# Patient Record
Sex: Male | Born: 1975 | Race: White | Hispanic: No | Marital: Married | Smoking: Former smoker
Health system: Southern US, Community
[De-identification: ages and names within clinical notes are randomized; demographics above are authoritative.]

## PROBLEM LIST (undated history)

## (undated) DIAGNOSIS — K56609 Unspecified intestinal obstruction, unspecified as to partial versus complete obstruction: Secondary | ICD-10-CM

## (undated) DIAGNOSIS — K219 Gastro-esophageal reflux disease without esophagitis: Secondary | ICD-10-CM

## (undated) DIAGNOSIS — Z136 Encounter for screening for cardiovascular disorders: Secondary | ICD-10-CM

## (undated) HISTORY — PX: KNEE SURGERY: SHX244

## (undated) HISTORY — DX: Unspecified intestinal obstruction, unspecified as to partial versus complete obstruction: K56.609

## (undated) HISTORY — PX: OTHER SURGICAL HISTORY: SHX169

---

## 1997-09-26 ENCOUNTER — Inpatient Hospital Stay (HOSPITAL_COMMUNITY): Admission: EM | Admit: 1997-09-26 | Discharge: 1997-09-28 | Payer: Self-pay | Admitting: Emergency Medicine

## 2002-01-25 ENCOUNTER — Emergency Department (HOSPITAL_COMMUNITY): Admission: EM | Admit: 2002-01-25 | Discharge: 2002-01-25 | Payer: Self-pay | Admitting: Emergency Medicine

## 2002-05-16 ENCOUNTER — Ambulatory Visit (HOSPITAL_BASED_OUTPATIENT_CLINIC_OR_DEPARTMENT_OTHER): Admission: RE | Admit: 2002-05-16 | Discharge: 2002-05-16 | Payer: Self-pay | Admitting: Orthopedic Surgery

## 2002-06-16 ENCOUNTER — Encounter: Payer: Self-pay | Admitting: Emergency Medicine

## 2002-06-16 ENCOUNTER — Emergency Department (HOSPITAL_COMMUNITY): Admission: EM | Admit: 2002-06-16 | Discharge: 2002-06-16 | Payer: Self-pay

## 2003-07-03 ENCOUNTER — Emergency Department (HOSPITAL_COMMUNITY): Admission: EM | Admit: 2003-07-03 | Discharge: 2003-07-03 | Payer: Self-pay | Admitting: Emergency Medicine

## 2004-04-28 ENCOUNTER — Emergency Department (HOSPITAL_COMMUNITY): Admission: EM | Admit: 2004-04-28 | Discharge: 2004-04-28 | Payer: Self-pay | Admitting: Emergency Medicine

## 2006-04-11 ENCOUNTER — Emergency Department (HOSPITAL_COMMUNITY): Admission: EM | Admit: 2006-04-11 | Discharge: 2006-04-12 | Payer: Self-pay | Admitting: Emergency Medicine

## 2008-02-03 IMAGING — CT CT ABDOMEN W/ CM
2 of 5 series · 17 of 46 positions shown, 19 images · IV contrast (APPLIED)
Comparison: None.

ABDOMEN CT WITH CONTRAST:

CLINICAL DATA: Left-sided abdominal pain
TECHNIQUE: Multidetector CT imaging of the abdomen and pelvis was performed
following the standard protocol during bolus administration of intravenous
contrast.

Contrast:  100 cc Omnipaque 300

[Series 2: abd/pelv with 5.0 b31f st · axial · 0.68mm/px · z∈[-463,-28]mm · 14 of 97 slices shown, 16 images]
[im 5/97  soft-tissue]
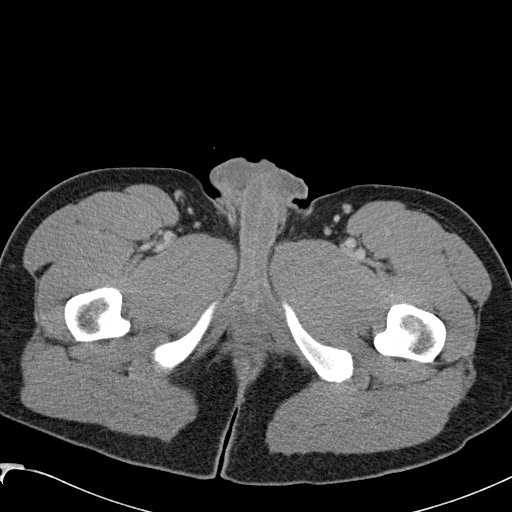
[im 5/97  bone]
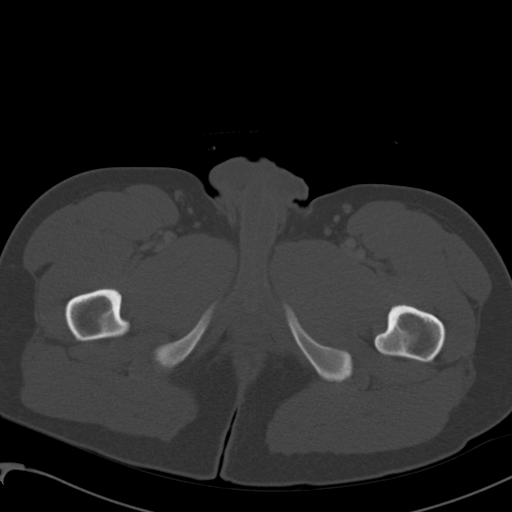
[im 15/97  soft-tissue]
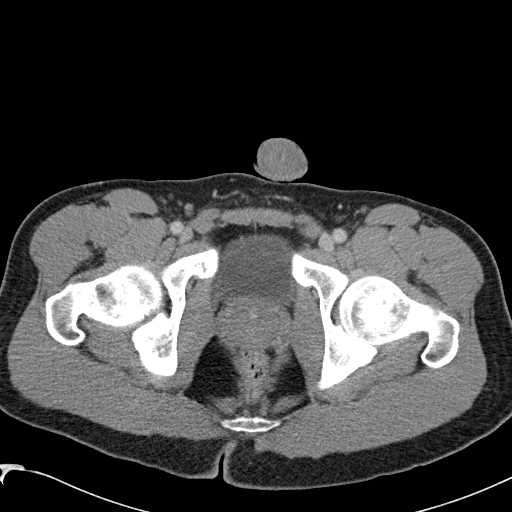
[im 20/97  soft-tissue]
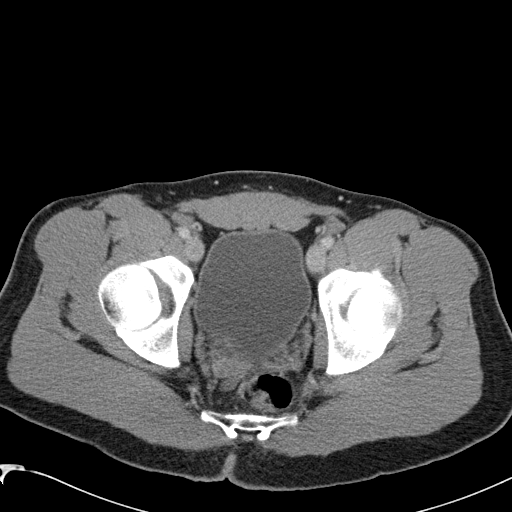
[im 25/97  soft-tissue]
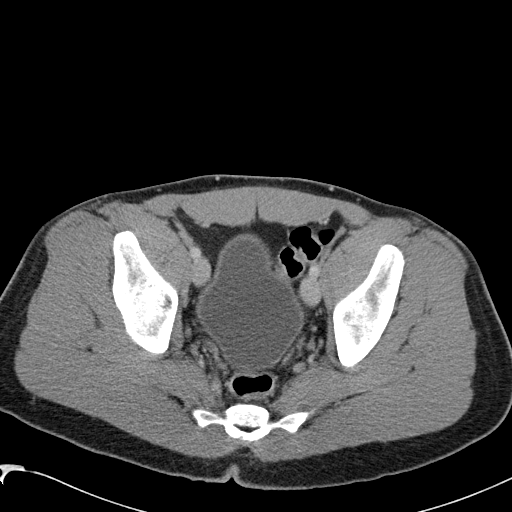
[im 34/97  soft-tissue]
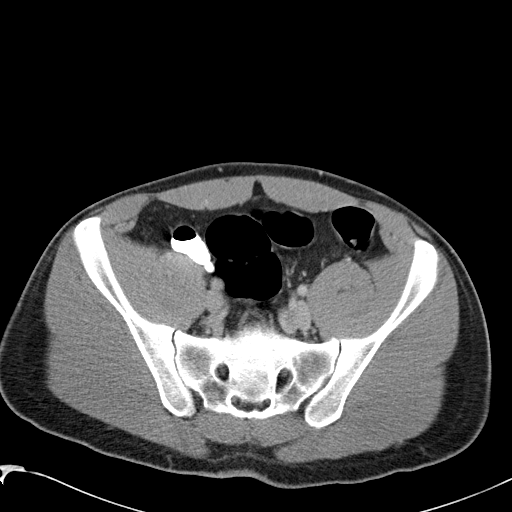
[im 39/97  soft-tissue]
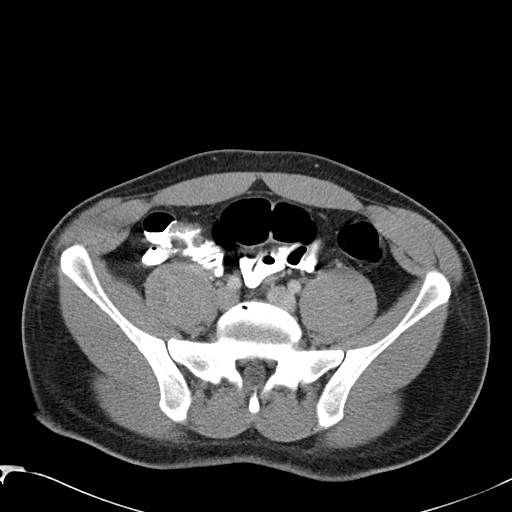
[im 44/97  soft-tissue]
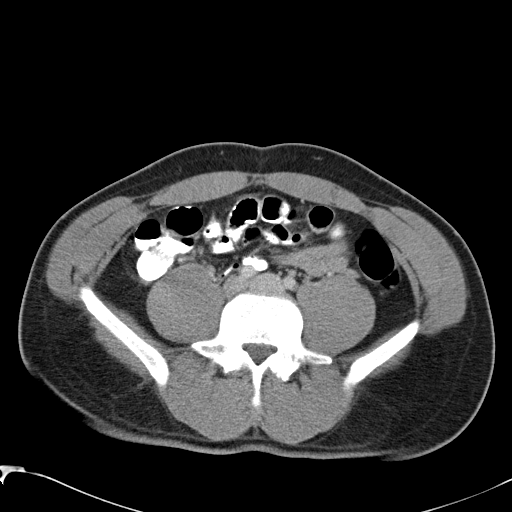
[im 53/97  soft-tissue]
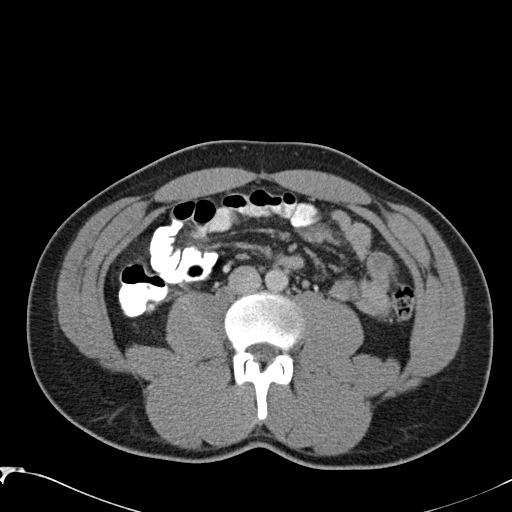
[im 58/97  soft-tissue]
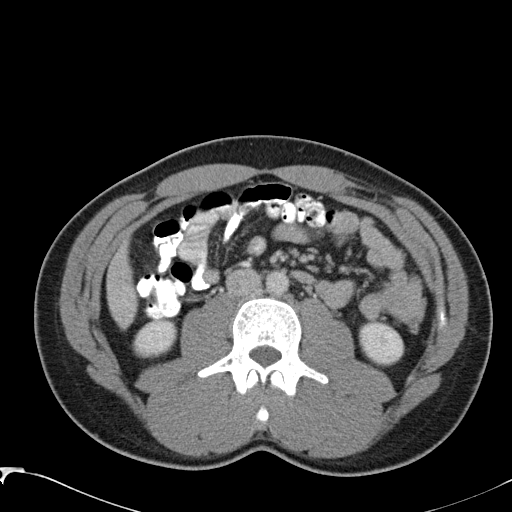
[im 58/97  bone]
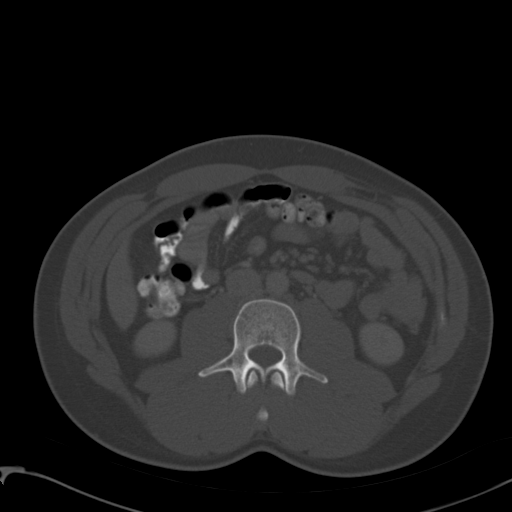
[im 63/97  soft-tissue]
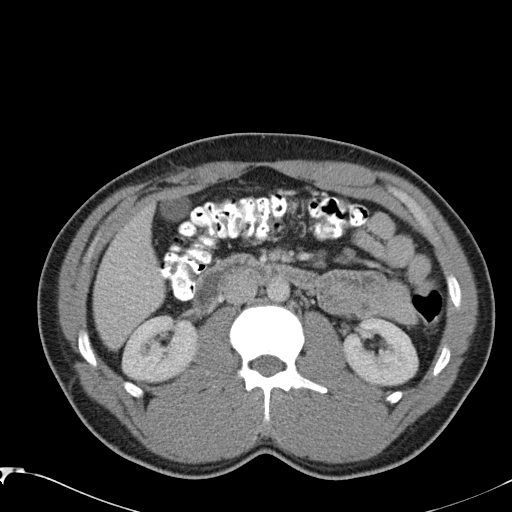
[im 73/97  soft-tissue]
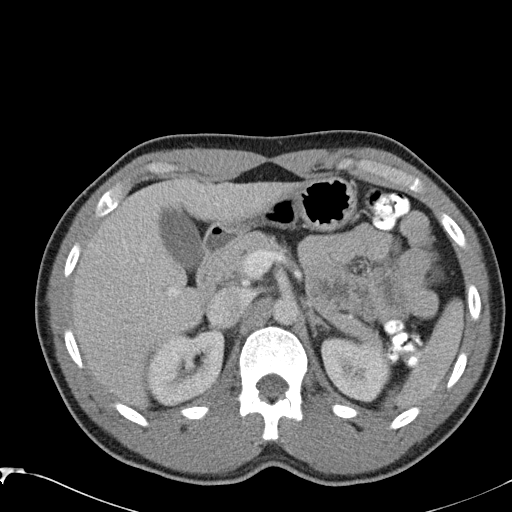
[im 77/97  soft-tissue]
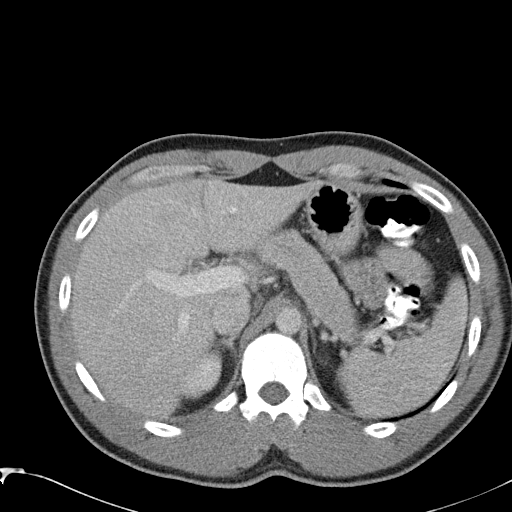
[im 82/97  soft-tissue]
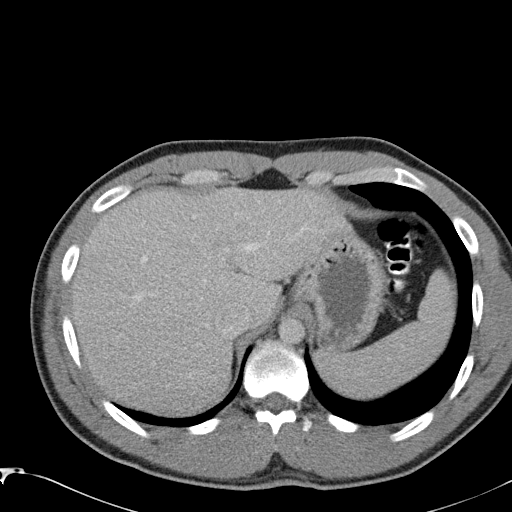
[im 92/97  soft-tissue]
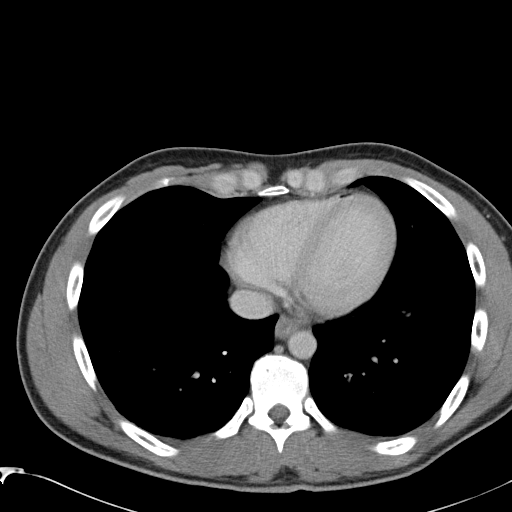

[Series 602: cor · coronal · 0.94mm/px · 3 of 99 slices shown]
[im 33/99  soft-tissue]
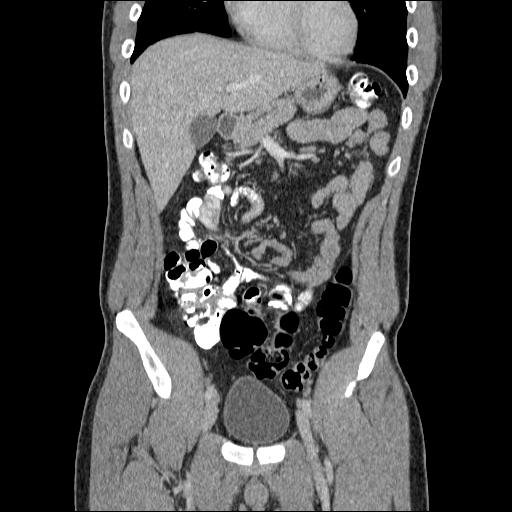
[im 44/99  soft-tissue]
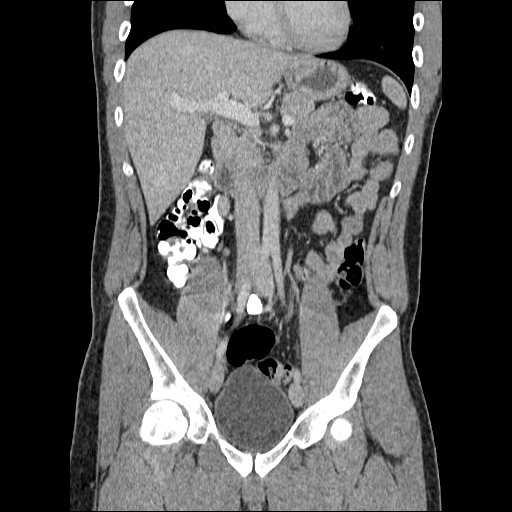
[im 55/99  soft-tissue]
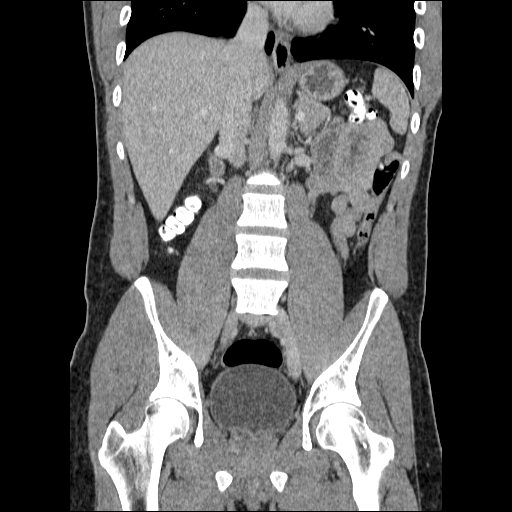

[17 of 46 positions shown; findings below may reference images not displayed]

FINDINGS: The liver, spleen, stomach, duodenum, pancreas, gallbladder, adrenal
glands, and kidneys are unremarkable. No abdominal lymphadenopathy. No
intraperitoneal free fluid. Bowel loops are unremarkable.
IMPRESSION: No acute findings in the abdomen.

PELVIS CT WITH CONTRAST:
FINDINGS: No evidence for intraperitoneal free fluid. No pelvic
lymphadenopathy. 1.3 cm cystic focus posterior to the right seminal vesicle may
be a small right seminal vesicle cyst. The urinary bladder is unremarkable. No
distal ureteral or bladder calculi. The terminal ileum and appendix are normal.

Bone windows are unremarkable.
IMPRESSION: No acute findings in the anatomic pelvis.

Question small right seminal vesicle cyst.

## 2011-09-24 ENCOUNTER — Emergency Department (HOSPITAL_COMMUNITY)
Admission: EM | Admit: 2011-09-24 | Discharge: 2011-09-24 | Disposition: A | Discharge status: Home / Self Care | Payer: BC Managed Care – PPO | Attending: Emergency Medicine | Admitting: Emergency Medicine

## 2011-09-24 ENCOUNTER — Encounter (HOSPITAL_COMMUNITY): Payer: Self-pay | Admitting: Emergency Medicine

## 2011-09-24 DIAGNOSIS — L03039 Cellulitis of unspecified toe: Secondary | ICD-10-CM | POA: Insufficient documentation

## 2011-09-24 DIAGNOSIS — L600 Ingrowing nail: Secondary | ICD-10-CM

## 2011-09-24 DIAGNOSIS — Z885 Allergy status to narcotic agent status: Secondary | ICD-10-CM | POA: Insufficient documentation

## 2011-09-24 DIAGNOSIS — M79609 Pain in unspecified limb: Secondary | ICD-10-CM | POA: Insufficient documentation

## 2011-09-24 DIAGNOSIS — IMO0002 Reserved for concepts with insufficient information to code with codable children: Secondary | ICD-10-CM

## 2011-09-24 DIAGNOSIS — F17200 Nicotine dependence, unspecified, uncomplicated: Secondary | ICD-10-CM | POA: Insufficient documentation

## 2011-09-24 MED ORDER — LIDOCAINE HCL 2 % IJ SOLN: 10.0000 mL | Freq: Once | INTRAMUSCULAR | Status: DC

## 2011-09-24 MED ORDER — SULFAMETHOXAZOLE-TRIMETHOPRIM 800-160 MG PO TABS: 1.0000 | ORAL_TABLET | Freq: Two times a day (BID) | ORAL | Status: AC

## 2011-09-24 MED ORDER — DICLOFENAC SODIUM 75 MG PO TBEC: 75.0000 mg | DELAYED_RELEASE_TABLET | Freq: Two times a day (BID) | ORAL | Status: DC

## 2011-09-24 NOTE — ED Notes (Signed)
Pt reports one month hx of ingrown toenail-r/gfoot -great (5th) toe

## 2011-09-24 NOTE — ED Provider Notes (Signed)
History     CSN: [id number]  Arrival date & time 09/24/11  1601   First MD Initiated Contact with Patient 09/24/11 1604     5:22 PM HPI Patient reports for one month's had a worsening right great toe ingrown toenail. Ports gradually beginning to have infection in his toe. Reports purulent drainage occasionally. Reports mild improvement with warm water soaks. 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 Denies fever but states his toe is turning red.  Patient is a 36 y.o. male presenting with toe pain. The history is provided by the patient.  Toe Pain This is a new problem. The current episode started in the past 7 days. The problem occurs constantly. The problem has been gradually worsening. Pertinent negatives include no chills, fever, joint swelling, numbness or weakness. The symptoms are aggravated by walking and standing. He has tried nothing (warm water soaks) for the symptoms. The treatment provided mild relief.    History reviewed. No pertinent past medical history.  Past Surgical History  Procedure Date  . Knee surgery   . Thumb surgery     History reviewed. No pertinent family history.  History  Substance Use Topics  . Smoking status: Not on file  . Smokeless tobacco: Current User    Types: Snuff  . Alcohol Use: Yes      Review of Systems  Constitutional: Negative for fever and chills.  Musculoskeletal: Negative for joint swelling.       Toe pain and infection  Skin: Positive for wound.  Neurological: Negative for weakness and numbness.  All other systems reviewed and are negative.    Allergies  Codeine and Other  Home Medications  No current outpatient prescriptions on file.  BP 127/67  Pulse 83  Temp 98.2 F (36.8 C) (Oral)  SpO2 96%  Physical Exam  Vitals reviewed. Constitutional: He is oriented to person, place, and time. He appears well-developed and well-nourished.  HENT:  Head: Normocephalic and atraumatic.    Eyes: Pupils are equal, round, and reactive to light.  Musculoskeletal:       Right toe medial paronychia. Mild active drainage. Surrounding erythema.   Neurological: He is alert and oriented to person, place, and time.  Skin: Skin is warm and dry. No rash noted. No erythema. No pallor.  Psychiatric: He has a normal mood and affect. His behavior is normal.    ED Course  Drain paronychia Date/Time: 09/24/2011 5:46 PM Performed by: Thomasene Lot Authorized by: Thomasene Lot Consent: Verbal consent obtained. Consent given by: patient Patient understanding: patient states understanding of the procedure being performed Patient identity confirmed: verbally with patient Local anesthesia used: yes Anesthesia: digital block Local anesthetic: lidocaine 2% without epinephrine Anesthetic total: 4 ml Patient sedated: no Patient tolerance: Patient tolerated the procedure well with no immediate complications.  NAIL REMOVAL Date/Time: 09/24/2011 5:46 AM Performed by: Thomasene Lot Authorized by: Thomasene Lot Consent: Verbal consent obtained. Consent given by: patient Patient understanding: patient states understanding of the procedure being performed Patient identity confirmed: verbally with patient Time out: Immediately prior to procedure a "time out" was called to verify the correct patient, procedure, equipment, support staff and site/side marked as required. Location: right foot Anesthesia: digital block Local anesthetic: lidocaine 2% without epinephrine Anesthetic total: 4 ml Patient sedated: no Preparation: skin prepped with alcohol and skin prepped with Betadine Amount removed: 1/5 Wedge excision of skin of nail fold: no Nail bed sutured: no Nail matrix removed: none Removed nail replaced  and anchored: no Dressing: 4x4, gauze roll and antibiotic ointment Patient tolerance: Patient tolerated the procedure well with no immediate complications.     MDM   Provided  patient with referral for podiatry if persistent symptoms. Advised continued use of warm water compresses. Will prescribe Bactrim in case of infection since entire distal tip of toe is erythematous       Thomasene Lot, PA-C 09/24/11 1823

## 2011-09-24 NOTE — Discharge Instructions (Signed)
Infected Ingrown Toenail  An infected ingrown toenail occurs when the nail edge grows into the skin and bacteria invade the area. Symptoms include pain, tenderness, swelling, and pus drainage from the edge of the nail. Poorly fitting shoes, minor injuries, and improper cutting of the toenail may also contribute to the problem. You should cut your toenails squarely instead of rounding the edges. Do not cut them too short. Avoid tight or pointed toe shoes. Sometimes the ingrown portion of the nail must be removed. If your toenail is removed, it can take 3-4 months for it to re-grow.  HOME CARE INSTRUCTIONS     Soak your infected toe in warm water for 20-30 minutes, 2 to 3 times a day.    Packing or dressings applied to the area should be changed daily.    Take medicine as directed and finish them.    Reduce activities and keep your foot elevated when able to reduce swelling and discomfort. Do this until the infection gets better.    Wear sandals or go barefoot as much as possible while the infected area is sensitive.    See your caregiver for follow-up care in 2-3 days if the infection is not better.   SEEK MEDICAL CARE IF:    Your toe is becoming more red, swollen or painful.  MAKE SURE YOU:     Understand these instructions.    Will watch your condition.    Will get help right away if you are not doing well or get worse.   Document Released: 04/29/2004 Document Revised: 03/11/2011 Document Reviewed: 03/18/2008  [employer] Patient Information 2012 [employer].

## 2011-09-24 NOTE — ED Notes (Signed)
Correction 1st toe r/foot

## 2011-09-25 NOTE — ED Provider Notes (Signed)
Medical screening examination/treatment/procedure(s) were performed by non-physician practitioner and as supervising physician I was immediately available for consultation/collaboration.   Cleora Karnik M Stepheny Canal, MD 09/25/11 0037 

## 2012-03-22 ENCOUNTER — Other Ambulatory Visit: Payer: Self-pay | Admitting: Neurosurgery

## 2012-03-23 ENCOUNTER — Encounter (HOSPITAL_COMMUNITY): Payer: Self-pay | Admitting: Pharmacy Technician

## 2012-03-27 ENCOUNTER — Encounter (HOSPITAL_COMMUNITY)
Admission: RE | Admit: 2012-03-27 | Discharge: 2012-03-27 | Disposition: A | Discharge status: Home / Self Care | Payer: BC Managed Care – PPO | Source: Ambulatory Visit | Attending: Neurosurgery | Admitting: Neurosurgery

## 2012-03-27 ENCOUNTER — Encounter (HOSPITAL_COMMUNITY): Payer: Self-pay

## 2012-03-27 HISTORY — DX: Gastro-esophageal reflux disease without esophagitis: K21.9

## 2012-03-27 HISTORY — DX: Encounter for screening for cardiovascular disorders: Z13.6

## 2012-03-27 LAB — BASIC METABOLIC PANEL
BUN: 17 mg/dL (ref 6–23)
CO2: 26 mEq/L (ref 19–32)
Chloride: 102 mEq/L (ref 96–112)
Creatinine, Ser: 1.16 mg/dL (ref 0.50–1.35)
Glucose, Bld: 92 mg/dL (ref 70–99)

## 2012-03-27 LAB — CBC WITH DIFFERENTIAL/PLATELET
Basophils Absolute: 0.1 10*3/uL (ref 0.0–0.1)
Basophils Relative: 1 % (ref 0–1)
Eosinophils Relative: 6 % — ABNORMAL HIGH (ref 0–5)
HCT: 39.6 % (ref 39.0–52.0)
MCHC: 35.4 g/dL (ref 30.0–36.0)
MCV: 85.2 fL (ref 78.0–100.0)
Monocytes Absolute: 0.7 10*3/uL (ref 0.1–1.0)
RDW: 12.4 % (ref 11.5–15.5)

## 2012-03-27 LAB — URINALYSIS, ROUTINE W REFLEX MICROSCOPIC
Bilirubin Urine: NEGATIVE
Hgb urine dipstick: NEGATIVE
Ketones, ur: NEGATIVE mg/dL
Nitrite: NEGATIVE
pH: 5.5 (ref 5.0–8.0)

## 2012-03-27 LAB — PROTIME-INR: INR: 1.05 (ref 0.00–1.49)

## 2012-03-27 NOTE — Progress Notes (Signed)
1530   PATIENT STATED HE HAD STRESS TREADMILL, HE THINKS MORE THEN 5-6 YRS AGO, BUT CAN'T REMEMBER WHERE.....he DID NOT HAVE ANY SYMPTOMS, HE HAD A FRIEND THAT WAS YOUNG AND HAD HEART PROBLEMS AND HE WANTED A BASELINE...TEST WAS NORMAL............DA

## 2012-03-27 NOTE — Pre-Procedure Instructions (Signed)
20 Darrell Cooper  03/27/2012   Your procedure is scheduled on: Tuesday, December 31   Report to [facility] at 6:30 AM. (Per your surgeon's request)  Call this number if you have problems the morning of surgery: [phone]   Remember:   Do not eat food or drink any liquids:After Midnight Monday.    Take these medicines the morning of surgery with A SIP OF WATER: None   Do not wear jewelry.  Do not wear lotions, powders, or colognes. You may NOT wear deodorant.   Men may shave face and neck.   Do not bring valuables to the hospital.  Contacts, dentures or bridgework may not be worn into surgery.   Leave suitcase in the car. After surgery it may be brought to your room.  For patients admitted to the hospital, checkout time is 11:00 AM the day of discharge.   Patients discharged the day of surgery will not be allowed to drive home.   Name and phone number of your driver:    Special Instructions: Shower using CHG 2 nights before surgery and the night before surgery.  If you shower the day of surgery use CHG.  Use special wash - you have one bottle of CHG for all showers.  You should use approximately 1/3 of the bottle for each shower.   Please read over the following fact sheets that you were given: Pain Booklet, Coughing and Deep Breathing, MRSA Information and Surgical Site Infection Prevention

## 2012-04-03 MED ORDER — CEFAZOLIN SODIUM-DEXTROSE 2-3 GM-% IV SOLR
2.0000 g | INTRAVENOUS | Status: DC
  Filled 2012-04-03: qty 50

## 2012-04-04 ENCOUNTER — Encounter (HOSPITAL_COMMUNITY): Payer: Self-pay | Admitting: *Deleted

## 2012-04-04 ENCOUNTER — Encounter (HOSPITAL_COMMUNITY)
Admission: RE | Disposition: A | Discharge status: Home / Self Care | Payer: Self-pay | Source: Ambulatory Visit | Attending: Neurosurgery

## 2012-04-04 ENCOUNTER — Ambulatory Visit (HOSPITAL_COMMUNITY): Payer: BC Managed Care – PPO

## 2012-04-04 ENCOUNTER — Encounter (HOSPITAL_COMMUNITY): Payer: Self-pay | Admitting: Anesthesiology

## 2012-04-04 ENCOUNTER — Ambulatory Visit (HOSPITAL_COMMUNITY)
Admission: RE | Admit: 2012-04-04 | Discharge: 2012-04-04 | Disposition: A | Discharge status: Home / Self Care | Payer: BC Managed Care – PPO | Source: Ambulatory Visit | Attending: Neurosurgery | Admitting: Neurosurgery

## 2012-04-04 ENCOUNTER — Ambulatory Visit (HOSPITAL_COMMUNITY): Payer: BC Managed Care – PPO | Admitting: Anesthesiology

## 2012-04-04 DIAGNOSIS — Z01812 Encounter for preprocedural laboratory examination: Secondary | ICD-10-CM | POA: Insufficient documentation

## 2012-04-04 DIAGNOSIS — M4712 Other spondylosis with myelopathy, cervical region: Secondary | ICD-10-CM | POA: Insufficient documentation

## 2012-04-04 DIAGNOSIS — M4802 Spinal stenosis, cervical region: Secondary | ICD-10-CM | POA: Insufficient documentation

## 2012-04-04 DIAGNOSIS — M5000 Cervical disc disorder with myelopathy, unspecified cervical region: Secondary | ICD-10-CM | POA: Insufficient documentation

## 2012-04-04 HISTORY — PX: ANTERIOR CERVICAL DECOMP/DISCECTOMY FUSION: SHX1161

## 2012-04-04 SURGERY — ANTERIOR CERVICAL DECOMPRESSION/DISCECTOMY FUSION 2 LEVELS
Anesthesia: General | Site: Neck | Wound class: Clean

## 2012-04-04 MED ORDER — ACETAMINOPHEN 325 MG PO TABS: 650.0000 mg | ORAL_TABLET | ORAL | Status: DC | PRN

## 2012-04-04 MED ORDER — MIDAZOLAM HCL 5 MG/5ML IJ SOLN
INTRAMUSCULAR | Status: DC | PRN
  Administered 2012-04-04 (×2): 1 mg via INTRAVENOUS

## 2012-04-04 MED ORDER — CEFAZOLIN SODIUM 1-5 GM-% IV SOLN
1.0000 g | Freq: Three times a day (TID) | INTRAVENOUS | Status: DC
  Administered 2012-04-04: 1 g via INTRAVENOUS
  Filled 2012-04-04 (×2): qty 50

## 2012-04-04 MED ORDER — OXYCODONE HCL 5 MG PO TABS: 5.0000 mg | ORAL_TABLET | Freq: Once | ORAL | Status: DC | PRN

## 2012-04-04 MED ORDER — LIDOCAINE HCL (CARDIAC) 20 MG/ML IV SOLN
INTRAVENOUS | Status: DC | PRN
  Administered 2012-04-04: 70 mg via INTRAVENOUS

## 2012-04-04 MED ORDER — NEOSTIGMINE METHYLSULFATE 1 MG/ML IJ SOLN
INTRAMUSCULAR | Status: DC | PRN
  Administered 2012-04-04: 4 mg via INTRAVENOUS

## 2012-04-04 MED ORDER — GLYCOPYRROLATE 0.2 MG/ML IJ SOLN
INTRAMUSCULAR | Status: DC | PRN
  Administered 2012-04-04: 0.6 mg via INTRAVENOUS
  Administered 2012-04-04: 0.2 mg via INTRAVENOUS

## 2012-04-04 MED ORDER — HYDROCODONE-ACETAMINOPHEN 5-325 MG PO TABS: 1.0000 | ORAL_TABLET | ORAL | Status: DC | PRN

## 2012-04-04 MED ORDER — MAGNESIUM HYDROXIDE 400 MG/5ML PO SUSP: 30.0000 mL | Freq: Every day | ORAL | Status: DC | PRN

## 2012-04-04 MED ORDER — LIDOCAINE-EPINEPHRINE 1 %-1:100000 IJ SOLN
INTRAMUSCULAR | Status: DC | PRN
  Administered 2012-04-04: 10 mL

## 2012-04-04 MED ORDER — ACETAMINOPHEN 10 MG/ML IV SOLN
INTRAVENOUS | Status: DC | PRN
  Administered 2012-04-04: 1000 mg via INTRAVENOUS

## 2012-04-04 MED ORDER — HEMOSTATIC AGENTS (NO CHARGE) OPTIME
TOPICAL | Status: DC | PRN
  Administered 2012-04-04: 1 via TOPICAL

## 2012-04-04 MED ORDER — ACETAMINOPHEN 10 MG/ML IV SOLN
INTRAVENOUS | Status: AC
  Filled 2012-04-04: qty 100

## 2012-04-04 MED ORDER — DEXAMETHASONE SODIUM PHOSPHATE 10 MG/ML IJ SOLN
INTRAMUSCULAR | Status: DC | PRN
  Administered 2012-04-04: 10 mg via INTRAVENOUS

## 2012-04-04 MED ORDER — SODIUM CHLORIDE 0.9 % IJ SOLN: 3.0000 mL | INTRAMUSCULAR | Status: DC | PRN

## 2012-04-04 MED ORDER — TRAMADOL HCL 50 MG PO TABS
50.0000 mg | ORAL_TABLET | Freq: Four times a day (QID) | ORAL | Status: DC | PRN
  Filled 2012-04-04: qty 1

## 2012-04-04 MED ORDER — ONDANSETRON HCL 4 MG/2ML IJ SOLN: 4.0000 mg | INTRAMUSCULAR | Status: DC | PRN

## 2012-04-04 MED ORDER — OXYCODONE HCL 5 MG/5ML PO SOLN: 5.0000 mg | Freq: Once | ORAL | Status: DC | PRN

## 2012-04-04 MED ORDER — BACITRACIN 50000 UNITS IM SOLR
INTRAMUSCULAR | Status: AC
  Filled 2012-04-04: qty 1

## 2012-04-04 MED ORDER — DIPHENHYDRAMINE HCL 25 MG PO CAPS: 25.0000 mg | ORAL_CAPSULE | ORAL | Status: DC | PRN

## 2012-04-04 MED ORDER — CYCLOBENZAPRINE HCL 10 MG PO TABS: 10.0000 mg | ORAL_TABLET | Freq: Three times a day (TID) | ORAL | Status: DC | PRN

## 2012-04-04 MED ORDER — ZOLPIDEM TARTRATE 5 MG PO TABS: 5.0000 mg | ORAL_TABLET | Freq: Every evening | ORAL | Status: DC | PRN

## 2012-04-04 MED ORDER — SODIUM CHLORIDE 0.9 % IV SOLN: 250.0000 mL | INTRAVENOUS | Status: DC

## 2012-04-04 MED ORDER — PROMETHAZINE HCL 25 MG/ML IJ SOLN: 12.5000 mg | INTRAMUSCULAR | Status: DC | PRN

## 2012-04-04 MED ORDER — SODIUM CHLORIDE 0.9 % IV SOLN
INTRAVENOUS | Status: AC
  Filled 2012-04-04: qty 500

## 2012-04-04 MED ORDER — HYDROMORPHONE HCL PF 1 MG/ML IJ SOLN
0.2500 mg | INTRAMUSCULAR | Status: DC | PRN
  Administered 2012-04-04 (×4): 0.5 mg via INTRAVENOUS

## 2012-04-04 MED ORDER — METHOCARBAMOL 100 MG/ML IJ SOLN
500.0000 mg | Freq: Four times a day (QID) | INTRAVENOUS | Status: DC | PRN
  Administered 2012-04-04: 500 mg via INTRAVENOUS
  Filled 2012-04-04: qty 5

## 2012-04-04 MED ORDER — METHOCARBAMOL 500 MG PO TABS
500.0000 mg | ORAL_TABLET | Freq: Four times a day (QID) | ORAL | Status: DC | PRN
  Filled 2012-04-04: qty 1

## 2012-04-04 MED ORDER — SODIUM CHLORIDE 0.9 % IJ SOLN: 3.0000 mL | Freq: Two times a day (BID) | INTRAMUSCULAR | Status: DC

## 2012-04-04 MED ORDER — ONDANSETRON HCL 4 MG/2ML IJ SOLN
INTRAMUSCULAR | Status: DC | PRN
  Administered 2012-04-04: 4 mg via INTRAVENOUS

## 2012-04-04 MED ORDER — PHENYLEPHRINE HCL 10 MG/ML IJ SOLN
INTRAMUSCULAR | Status: DC | PRN
  Administered 2012-04-04: 40 ug via INTRAVENOUS
  Administered 2012-04-04: 120 ug via INTRAVENOUS
  Administered 2012-04-04 (×2): 40 ug via INTRAVENOUS
  Administered 2012-04-04 (×2): 80 ug via INTRAVENOUS

## 2012-04-04 MED ORDER — LACTATED RINGERS IV SOLN: INTRAVENOUS | Status: DC

## 2012-04-04 MED ORDER — DOCUSATE SODIUM 100 MG PO CAPS
100.0000 mg | ORAL_CAPSULE | Freq: Two times a day (BID) | ORAL | Status: DC
  Administered 2012-04-04: 100 mg via ORAL
  Filled 2012-04-04: qty 1

## 2012-04-04 MED ORDER — PROPOFOL 10 MG/ML IV BOLUS
INTRAVENOUS | Status: DC | PRN
  Administered 2012-04-04: 200 mg via INTRAVENOUS

## 2012-04-04 MED ORDER — PROMETHAZINE HCL 25 MG/ML IJ SOLN: 6.2500 mg | INTRAMUSCULAR | Status: DC | PRN

## 2012-04-04 MED ORDER — ACETAMINOPHEN 650 MG RE SUPP: 650.0000 mg | RECTAL | Status: DC | PRN

## 2012-04-04 MED ORDER — FENTANYL CITRATE 0.05 MG/ML IJ SOLN
INTRAMUSCULAR | Status: DC | PRN
  Administered 2012-04-04 (×2): 50 ug via INTRAVENOUS
  Administered 2012-04-04: 100 ug via INTRAVENOUS

## 2012-04-04 MED ORDER — HYDROMORPHONE HCL PF 1 MG/ML IJ SOLN
INTRAMUSCULAR | Status: AC
  Filled 2012-04-04: qty 2

## 2012-04-04 MED ORDER — THROMBIN 5000 UNITS EX KIT
PACK | CUTANEOUS | Status: DC | PRN
  Administered 2012-04-04 (×2): 5000 [IU] via TOPICAL

## 2012-04-04 MED ORDER — KETOROLAC TROMETHAMINE 30 MG/ML IJ SOLN
30.0000 mg | Freq: Four times a day (QID) | INTRAMUSCULAR | Status: DC
  Administered 2012-04-04: 30 mg via INTRAVENOUS
  Filled 2012-04-04: qty 1

## 2012-04-04 MED ORDER — EPHEDRINE SULFATE 50 MG/ML IJ SOLN
INTRAMUSCULAR | Status: DC | PRN
  Administered 2012-04-04: 10 mg via INTRAVENOUS

## 2012-04-04 MED ORDER — SODIUM CHLORIDE 0.9 % IR SOLN
Status: DC | PRN
  Administered 2012-04-04: 11:00:00

## 2012-04-04 MED ORDER — 0.9 % SODIUM CHLORIDE (POUR BTL) OPTIME
TOPICAL | Status: DC | PRN
  Administered 2012-04-04: 1000 mL

## 2012-04-04 MED ORDER — PROMETHAZINE HCL 25 MG PO TABS: 12.5000 mg | ORAL_TABLET | ORAL | Status: DC | PRN

## 2012-04-04 MED ORDER — OXYCODONE-ACETAMINOPHEN 5-325 MG PO TABS
1.0000 | ORAL_TABLET | ORAL | Status: DC | PRN
  Administered 2012-04-04: 0.5 via ORAL
  Filled 2012-04-04: qty 1
  Filled 2012-04-04: qty 2

## 2012-04-04 MED ORDER — LACTATED RINGERS IV SOLN
INTRAVENOUS | Status: DC | PRN
  Administered 2012-04-04 (×2): via INTRAVENOUS

## 2012-04-04 MED ORDER — ROCURONIUM BROMIDE 100 MG/10ML IV SOLN
INTRAVENOUS | Status: DC | PRN
  Administered 2012-04-04: 20 mg via INTRAVENOUS
  Administered 2012-04-04: 10 mg via INTRAVENOUS
  Administered 2012-04-04: 50 mg via INTRAVENOUS

## 2012-04-04 MED ORDER — MEPERIDINE HCL 25 MG/ML IJ SOLN: 6.2500 mg | INTRAMUSCULAR | Status: DC | PRN

## 2012-04-04 SURGICAL SUPPLY — 52 items
APL SKNCLS STERI-STRIP NONHPOA (GAUZE/BANDAGES/DRESSINGS) ×1
BAG DECANTER FOR FLEXI CONT (MISCELLANEOUS) ×2 IMPLANT
BANDAGE GAUZE ELAST BULKY 4 IN (GAUZE/BANDAGES/DRESSINGS) ×4 IMPLANT
BENZOIN TINCTURE PRP APPL 2/3 (GAUZE/BANDAGES/DRESSINGS) ×2 IMPLANT
BIT DRILL 14MM (INSTRUMENTS) IMPLANT
BONE CERV LORDOTIC 14.5X12X8 (Bone Implant) ×4 IMPLANT
BUR MATCHSTICK NEURO 3.0 LAGG (BURR) ×2 IMPLANT
CANISTER SUCTION 2500CC (MISCELLANEOUS) ×2 IMPLANT
CLOTH BEACON ORANGE TIMEOUT ST (SAFETY) ×2 IMPLANT
CONT SPEC 4OZ CLIKSEAL STRL BL (MISCELLANEOUS) ×2 IMPLANT
DRAPE LAPAROTOMY 100X72 PEDS (DRAPES) ×2 IMPLANT
DRAPE MICROSCOPE LEICA (MISCELLANEOUS) ×2 IMPLANT
DRAPE POUCH INSTRU U-SHP 10X18 (DRAPES) ×2 IMPLANT
DRILL 14MM (INSTRUMENTS) ×2
DURAPREP 6ML APPLICATOR 50/CS (WOUND CARE) ×2 IMPLANT
ELECT REM PT RETURN 9FT ADLT (ELECTROSURGICAL) ×2
ELECTRODE REM PT RTRN 9FT ADLT (ELECTROSURGICAL) ×1 IMPLANT
GAUZE SPONGE 4X4 16PLY XRAY LF (GAUZE/BANDAGES/DRESSINGS) IMPLANT
GLOVE ECLIPSE 7.5 STRL STRAW (GLOVE) ×3 IMPLANT
GLOVE EXAM NITRILE LRG STRL (GLOVE) IMPLANT
GLOVE EXAM NITRILE MD LF STRL (GLOVE) IMPLANT
GLOVE EXAM NITRILE XL STR (GLOVE) IMPLANT
GLOVE EXAM NITRILE XS STR PU (GLOVE) IMPLANT
GLOVE INDICATOR 8.0 STRL GRN (GLOVE) ×4 IMPLANT
GOWN BRE IMP SLV AUR LG STRL (GOWN DISPOSABLE) ×1 IMPLANT
GOWN BRE IMP SLV AUR XL STRL (GOWN DISPOSABLE) IMPLANT
GOWN STRL REIN 2XL LVL4 (GOWN DISPOSABLE) ×2 IMPLANT
GRAFT BNE SPCR VG2 14.5X12X8 (Bone Implant) IMPLANT
HEAD HALTER (SOFTGOODS) ×2 IMPLANT
KIT BASIN OR (CUSTOM PROCEDURE TRAY) ×2 IMPLANT
KIT ROOM TURNOVER OR (KITS) ×2 IMPLANT
NDL HYPO 25X1 1.5 SAFETY (NEEDLE) ×1 IMPLANT
NDL SPNL 20GX3.5 QUINCKE YW (NEEDLE) ×1 IMPLANT
NEEDLE HYPO 22GX1.5 SAFETY (NEEDLE) ×1 IMPLANT
NEEDLE HYPO 25X1 1.5 SAFETY (NEEDLE) ×2 IMPLANT
NEEDLE SPNL 20GX3.5 QUINCKE YW (NEEDLE) ×2 IMPLANT
NS IRRIG 1000ML POUR BTL (IV SOLUTION) ×2 IMPLANT
PACK LAMINECTOMY NEURO (CUSTOM PROCEDURE TRAY) ×2 IMPLANT
PAD ARMBOARD 7.5X6 YLW CONV (MISCELLANEOUS) ×6 IMPLANT
PATTIES SURGICAL .75X.75 (GAUZE/BANDAGES/DRESSINGS) ×1 IMPLANT
PIN DISTRACTION 14MM (PIN) ×4 IMPLANT
PLATE 34MM (Plate) ×1 IMPLANT
RUBBERBAND STERILE (MISCELLANEOUS) ×4 IMPLANT
SCREW 14MM (Screw) ×6 IMPLANT
SPONGE GAUZE 4X4 12PLY (GAUZE/BANDAGES/DRESSINGS) ×1 IMPLANT
SPONGE INTESTINAL PEANUT (DISPOSABLE) ×2 IMPLANT
STRIP CLOSURE SKIN 1/2X4 (GAUZE/BANDAGES/DRESSINGS) ×2 IMPLANT
SUT VIC AB 3-0 SH 8-18 (SUTURE) ×2 IMPLANT
SYR 20ML ECCENTRIC (SYRINGE) ×2 IMPLANT
TOWEL OR 17X24 6PK STRL BLUE (TOWEL DISPOSABLE) ×2 IMPLANT
TOWEL OR 17X26 10 PK STRL BLUE (TOWEL DISPOSABLE) ×2 IMPLANT
WATER STERILE IRR 1000ML POUR (IV SOLUTION) ×2 IMPLANT

## 2012-04-04 NOTE — Anesthesia Procedure Notes (Addendum)
Procedure Name: Intubation Date/Time: 04/04/2012 10:34 AM Performed by: Darcey Nora B Pre-anesthesia Checklist: Patient identified, Emergency Drugs available, Suction available and Patient being monitored Patient Re-evaluated:Patient Re-evaluated prior to inductionOxygen Delivery Method: Circle system utilized Preoxygenation: Pre-oxygenation with 100% oxygen Intubation Type: IV induction Ventilation: Mask ventilation without difficulty Laryngoscope Size: Mac and 3 Tube size: 8.0 mm    Date/Time: 04/04/2012 10:34 AM Performed by: Darcey Nora B Grade View: Grade I Tube type: Oral Tube size: 7.5 mm Number of attempts: 1 Airway Equipment and Method: Stylet Placement Confirmation: ETT inserted through vocal cords under direct vision,  breath sounds checked- equal and bilateral and positive ETCO2 Secured at: 22 (cm at teeth) cm Tube secured with: Tape Dental Injury: Teeth and Oropharynx as per pre-operative assessment

## 2012-04-04 NOTE — Op Note (Signed)
04/04/2012  12:41 PM  PATIENT:  Darrell Cooper  36 y.o. male  PRE-OPERATIVE DIAGNOSIS:  Cervical herniated nucleus pulposus with myelopathy, Cervicalgia, Cervical spondylosis with myelopathy, Cervical stenosis  POST-OPERATIVE DIAGNOSIS:  Cervical herniated nucleus pulposus with myelopathy, Cervicalgia, Cervical spondylosis with myelopathy, Cervical stenosis  PROCEDURE:  Procedure(s): ANTERIOR CERVICAL DECOMPRESSION/DISCECTOMY FUSION 2 LEVELS, C4-5, 5-6), lifenet bone, trestle plate  SURGEON:  Surgeon(s): Clydene Fake, MD Hewitt Shorts, MD-assist   ANESTHESIA:   general  EBL:  Total I/O In: 2000 [I.V.:2000] Out: -   BLOOD ADMINISTERED:none  DRAINS: none   SPECIMEN:  No Specimen  DICTATION: Neck shoulder pain arm tingling and found to be mild myelopathic on exam MRI showing spinal changes disc protrusion and herniation with CORD compression and cervical stenosis at 45 and 56 and patient brought in for 2 level ACF.  Patient brought to the operative general anesthesia induced patient placed in 10 pounds halter traction prepped draped sterile fashion 7 incision injected with 10 cc 1% lidocaine with epinephrine incision was made in the anterior cervical area from the midline to the interbody supplements of muscle incision taken of the platysma and hemostasis obtained with Bovie cauterization platysma was incised with a Bovie and blunt dissection taken to the intracerebral fascia the intracerebral spine. 2 needles were placed interspaces and x-rays attention needle was at the four to 5 and 56 spaces. The spaces were incised as the needle was removed and discectomy started with pituitary rongeurs at both levels. Lungs: Muscles reflected laterally using the Bovie and soaking retractor was placed distraction pins placed in C4 and C6 interspaces distracted microscope brought in for microdissection.  Starting four-part discectomy continued with curettes and 1 and 2 mm Kerrison punches  were used to remove posterior disc osteophyte and ligament decompressed the central canal and the central stenosis and bilateral foraminotomies performed. We used high-speed drill to remove callus endplate measured height a displaced to be 6 mm dissection or LifeNet allograft bone was tapped in place. At the 56 level the decompression discectomy done in the same manner we measured the space did again be 6 mm dissection or LifeNet cortical tapped in place. The distraction and distraction pins removed weight was removed from the traction we. About solution had very good hemostasis. A Tressel anterior cervical plate was placed with anterior cervical spine 2 screws placed the C4-C5 and C6 these were tightened lateral x-rays obtained showing good position plate-screw bone plugs at the four-part 6 level. Retractors removed hemostasis obtained with bipolar cauterization we urine about solution had very good hemostasis and the platysma closed with 3-0 Vicryl interrupted sutures subcutaneous tissue closed the same skin closed benzoin Steri-Strips dressing was placed patient and into cervical collar woken from anesthesia and transferred recovery room.    PLAN OF CARE: Admit for overnight observation  PATIENT DISPOSITION:  PACU - hemodynamically stable.

## 2012-04-04 NOTE — H&P (Signed)
See H& P.

## 2012-04-04 NOTE — Interval H&P Note (Signed)
History and Physical Interval Note:  04/04/2012 7:56 AM  Darrell Cooper  has presented today for surgery, with the diagnosis of Cervical hnp with myelopathy, Cervicalgia, Cervical spondylosis with myelopathy, Cervical stenosis  The various methods of treatment have been discussed with the patient and family. After consideration of risks, benefits and other options for treatment, the patient has consented to  Procedure(s) (LRB) with comments: ANTERIOR CERVICAL DECOMPRESSION/DISCECTOMY FUSION 2 LEVELS (N/A) - C4-5 C5-6 Anterior cervical decompression/diskectomy/fusion/lifenet bone/trestle plate as a surgical intervention .  The patient's history has been reviewed, patient examined, no change in status, stable for surgery.  I have reviewed the patient's chart and labs.  Questions were answered to the patient's satisfaction.     Pryce Folts R

## 2012-04-04 NOTE — Progress Notes (Signed)
Pt doing well. Pt is OOB ambulating in the hallway independently. Pt has voided x 2. Pt's pain is controlled with oral pain meds. Pt and wife given D/C instructions with Rx's, verbal understanding given. Pt D/C'd home via wheelchair @ 1845 per MD order. Rema Fendt, RN

## 2012-04-04 NOTE — Anesthesia Preprocedure Evaluation (Signed)
Anesthesia Evaluation  Patient identified by MRN, date of birth, ID band Patient awake    Reviewed: Allergy & Precautions, H&P , NPO status , Patient's Chart, lab work & pertinent test results  History of Anesthesia Complications Negative for: history of anesthetic complications  Airway Mallampati: I  Neck ROM: full    Dental No notable dental hx. (+) Teeth Intact   Pulmonary neg pulmonary ROS,  breath sounds clear to auscultation  Pulmonary exam normal       Cardiovascular negative cardio ROS  IRhythm:regular Rate:Normal     Neuro/Psych negative neurological ROS  negative psych ROS   GI/Hepatic Neg liver ROS, GERD-  ,  Endo/Other  negative endocrine ROS  Renal/GU negative Renal ROS  negative genitourinary   Musculoskeletal   Abdominal   Peds  Hematology negative hematology ROS (+)   Anesthesia Other Findings   Reproductive/Obstetrics negative OB ROS                           Anesthesia Physical Anesthesia Plan  ASA: I  Anesthesia Plan: General and General ETT   Post-op Pain Management:    Induction:   Airway Management Planned:   Additional Equipment:   Intra-op Plan:   Post-operative Plan:   Informed Consent: I have reviewed the patients History and Physical, chart, labs and discussed the procedure including the risks, benefits and alternatives for the proposed anesthesia with the patient or authorized representative who has indicated his/her understanding and acceptance.     Plan Discussed with: CRNA and Surgeon  Anesthesia Plan Comments:         Anesthesia Quick Evaluation

## 2012-04-04 NOTE — Transfer of Care (Signed)
Immediate Anesthesia Transfer of Care Note  Patient: Darrell Cooper  Procedure(s) Performed: Procedure(s) (LRB) with comments: ANTERIOR CERVICAL DECOMPRESSION/DISCECTOMY FUSION 2 LEVELS (N/A) - Cervical four-five, five-six Anterior cervical decompression/diskectomy/fusion/lifenet bone/trestle plate  Patient Location: PACU  Anesthesia Type:General  Level of Consciousness: awake, sedated and patient cooperative  Airway & Oxygen Therapy: Patient Spontanous Breathing and Patient connected to nasal cannula oxygen  Post-op Assessment: Report given to PACU RN and Post -op Vital signs reviewed and stable  Post vital signs: Reviewed and stable  Complications: No apparent anesthesia complications

## 2012-04-04 NOTE — Preoperative (Signed)
Beta Blockers   Reason not to administer Beta Blockers:Not Applicable 

## 2012-04-04 NOTE — Discharge Summary (Signed)
Physician Discharge Summary  Patient ID: JALIL LORUSSO MRN: [id number] DOB/AGE: Sep 13, 1975 36 y.o.  Admit date: 04/04/2012 Discharge date: 04/04/2012  Admission Diagnoses:Cervical herniated nucleus pulposus with myelopathy, Cervicalgia, Cervical spondylosis with myelopathy, Cervical stenosis   Discharge Diagnoses: Cervical herniated nucleus pulposus with myelopathy, Cervicalgia, Cervical spondylosis with myelopathy, Cervical stenosis  Active Problems:  * No active hospital problems. *    Discharged Condition: good  Hospital Course: pt admitted day of surgery - underwent procedure below - pt transferred to floor - pt doing well  Consults: None  Significant Diagnostic Studies: none  Treatments: surgery: ANTERIOR CERVICAL DECOMPRESSION/DISCECTOMY FUSION 2 LEVELS, C4-5, 5-6), lifenet bone, trestle plate   Discharge Exam: Blood pressure 129/87, pulse 93, temperature 98.4 F (36.9 C), temperature source Oral, resp. rate 18, SpO2 98.00%. Wound:c/d/i  Disposition: home     Medication List     As of 04/04/2012 12:46 PM    ASK your doctor about these medications              Signed: Clydene Fake, MD 04/04/2012, 12:46 PM

## 2012-04-04 NOTE — Anesthesia Postprocedure Evaluation (Signed)
  Anesthesia Post-op Note  Patient: Darrell Cooper  Procedure(s) Performed: Procedure(s) (LRB) with comments: ANTERIOR CERVICAL DECOMPRESSION/DISCECTOMY FUSION 2 LEVELS (N/A) - Cervical four-five, five-six Anterior cervical decompression/diskectomy/fusion/lifenet bone/trestle plate  Patient Location: PACU  Anesthesia Type:General  Level of Consciousness: awake  Airway and Oxygen Therapy: Patient Spontanous Breathing  Post-op Pain: mild  Post-op Assessment: Post-op Vital signs reviewed  Post-op Vital Signs: stable  Complications: No apparent anesthesia complications

## 2012-04-06 ENCOUNTER — Encounter (HOSPITAL_COMMUNITY): Payer: Self-pay | Admitting: Neurosurgery

## 2014-01-26 IMAGING — CR DG CERVICAL SPINE 2 OR 3 VIEWS
1 series · 1 of 1 positions shown · non-contrast
Comparison: MR 03/13/2012

CLINICAL DATA: Cervical fusion

CERVICAL SPINE - 2-3 VIEW

[view not recorded]
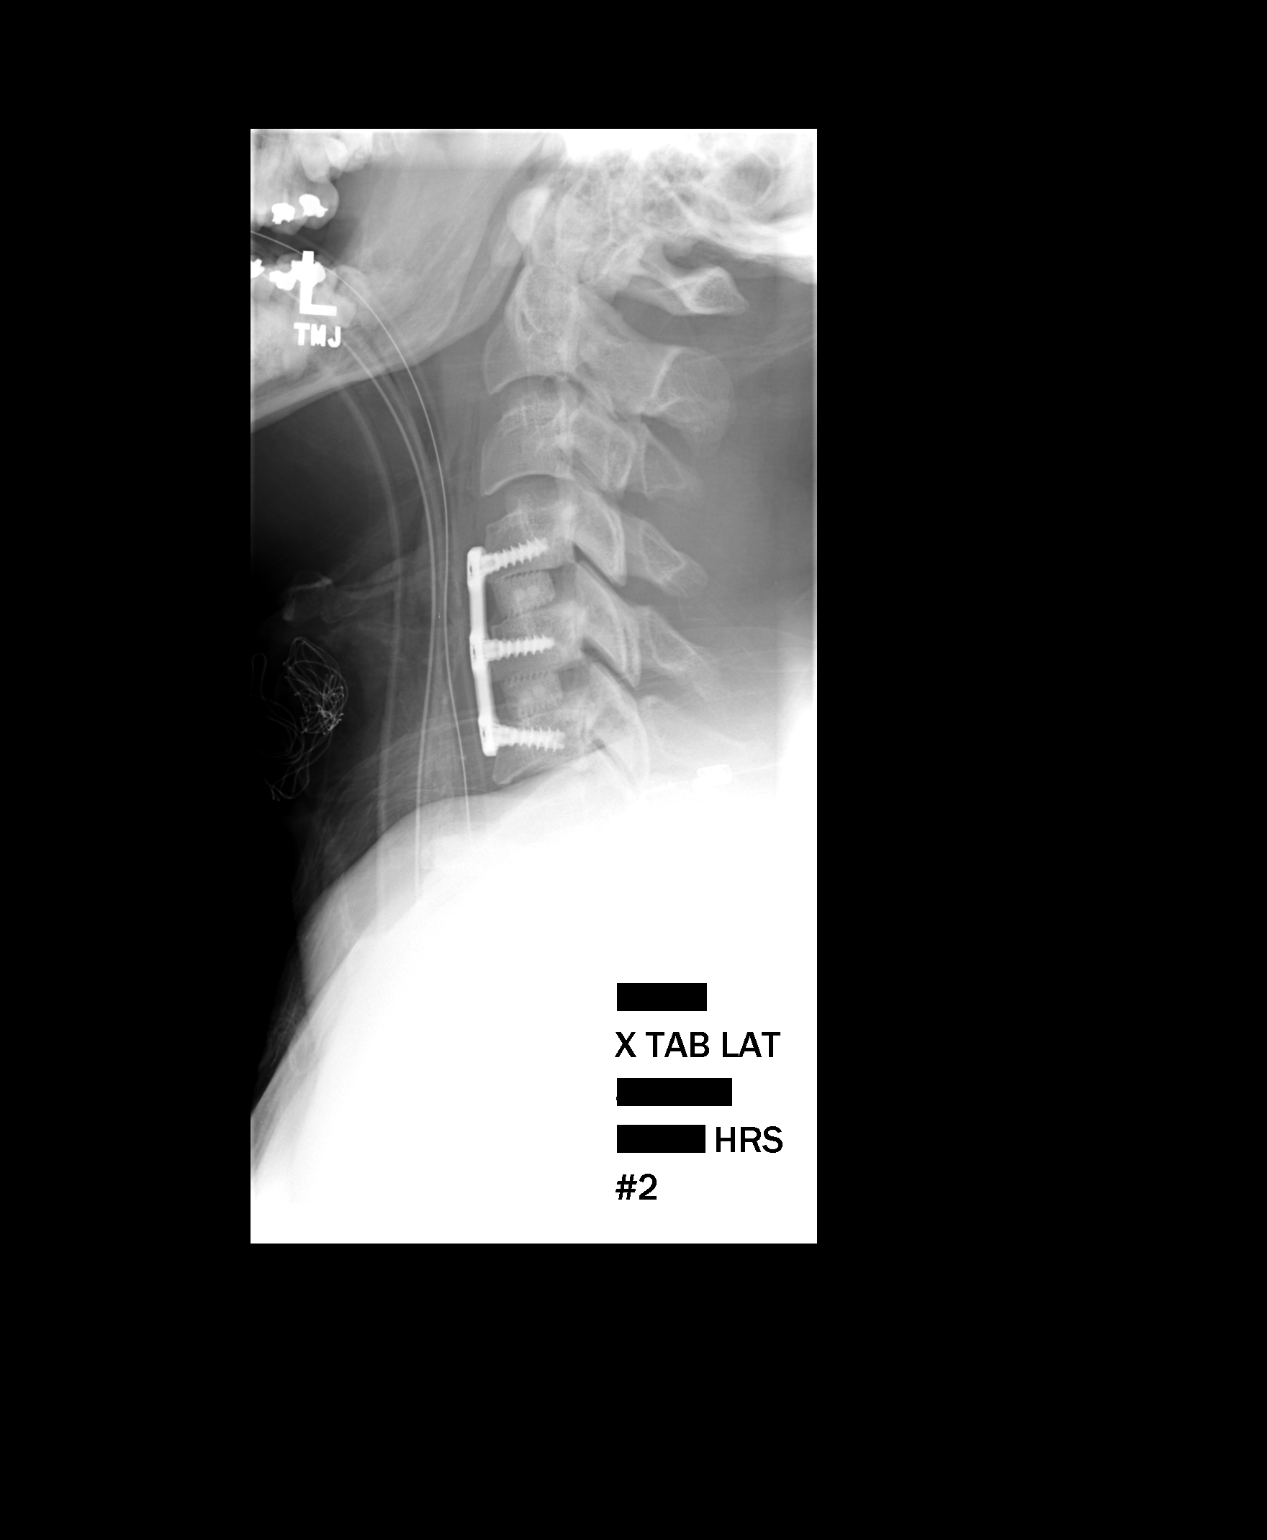

[1 of 1 positions shown; findings below may reference images not displayed]

FINDINGS: The first intraoperative lateral cervical radiograph
shows needles from anterior approach, tips at the C4-5 and C5-6
interspaces.

Second film shows changes of instrumented ACDF C4-C6, alignment
preserved.  Cervicothoracic junction not well seen.
IMPRESSION: ACDF C4-C6.

## 2019-03-22 ENCOUNTER — Other Ambulatory Visit: Payer: Self-pay

## 2019-03-22 ENCOUNTER — Ambulatory Visit: Payer: Commercial Managed Care - PPO | Attending: Internal Medicine

## 2019-03-22 DIAGNOSIS — Z20822 Contact with and (suspected) exposure to covid-19: Secondary | ICD-10-CM

## 2019-03-22 DIAGNOSIS — U071 COVID-19: Secondary | ICD-10-CM | POA: Insufficient documentation

## 2019-03-23 LAB — NOVEL CORONAVIRUS, NAA: SARS-CoV-2, NAA: DETECTED — AB

## 2019-03-23 NOTE — Progress Notes (Signed)
Order(s) created erroneously. Erroneous order ID: [id number]  Order moved by: Brigitte Pulse  Order move date/time: 03/23/2019 2:47 PM  Source Patient: [id number]  Source Contact: 03/22/2019  Destination Patient: [id number]  Destination Contact: 03/22/2019

## 2019-03-23 NOTE — Progress Notes (Signed)
Orders moved to this encounter.

## 2019-04-22 ENCOUNTER — Inpatient Hospital Stay (HOSPITAL_COMMUNITY)
Admission: EM | Admit: 2019-04-22 | Discharge: 2019-04-26 | DRG: 373 | Disposition: A | Discharge status: Home / Self Care | Payer: Commercial Managed Care - PPO | Attending: General Surgery | Admitting: General Surgery

## 2019-04-22 ENCOUNTER — Encounter (HOSPITAL_COMMUNITY): Payer: Self-pay | Admitting: Pharmacy Technician

## 2019-04-22 ENCOUNTER — Emergency Department (HOSPITAL_COMMUNITY): Payer: Commercial Managed Care - PPO

## 2019-04-22 ENCOUNTER — Other Ambulatory Visit: Payer: Self-pay

## 2019-04-22 DIAGNOSIS — E739 Lactose intolerance, unspecified: Secondary | ICD-10-CM | POA: Diagnosis present

## 2019-04-22 DIAGNOSIS — A040 Enteropathogenic Escherichia coli infection: Secondary | ICD-10-CM | POA: Diagnosis not present

## 2019-04-22 DIAGNOSIS — Z8711 Personal history of peptic ulcer disease: Secondary | ICD-10-CM | POA: Diagnosis not present

## 2019-04-22 DIAGNOSIS — Z981 Arthrodesis status: Secondary | ICD-10-CM

## 2019-04-22 DIAGNOSIS — Z20822 Contact with and (suspected) exposure to covid-19: Secondary | ICD-10-CM | POA: Diagnosis present

## 2019-04-22 DIAGNOSIS — R1013 Epigastric pain: Secondary | ICD-10-CM

## 2019-04-22 DIAGNOSIS — Z720 Tobacco use: Secondary | ICD-10-CM | POA: Diagnosis not present

## 2019-04-22 DIAGNOSIS — R935 Abnormal findings on diagnostic imaging of other abdominal regions, including retroperitoneum: Secondary | ICD-10-CM

## 2019-04-22 DIAGNOSIS — K219 Gastro-esophageal reflux disease without esophagitis: Secondary | ICD-10-CM | POA: Diagnosis present

## 2019-04-22 DIAGNOSIS — Z79899 Other long term (current) drug therapy: Secondary | ICD-10-CM

## 2019-04-22 DIAGNOSIS — Z885 Allergy status to narcotic agent status: Secondary | ICD-10-CM | POA: Diagnosis not present

## 2019-04-22 DIAGNOSIS — K56609 Unspecified intestinal obstruction, unspecified as to partial versus complete obstruction: Secondary | ICD-10-CM

## 2019-04-22 DIAGNOSIS — R109 Unspecified abdominal pain: Secondary | ICD-10-CM | POA: Diagnosis present

## 2019-04-22 DIAGNOSIS — K529 Noninfective gastroenteritis and colitis, unspecified: Secondary | ICD-10-CM

## 2019-04-22 LAB — COMPREHENSIVE METABOLIC PANEL
ALT: 30 U/L (ref 0–44)
AST: 27 U/L (ref 15–41)
Albumin: 4.5 g/dL (ref 3.5–5.0)
Alkaline Phosphatase: 69 U/L (ref 38–126)
Anion gap: 12 (ref 5–15)
BUN: 13 mg/dL (ref 6–20)
CO2: 22 mmol/L (ref 22–32)
Calcium: 10.4 mg/dL — ABNORMAL HIGH (ref 8.9–10.3)
Chloride: 103 mmol/L (ref 98–111)
Creatinine, Ser: 1.14 mg/dL (ref 0.61–1.24)
GFR calc Af Amer: >60 mL/min (ref >60)
GFR calc non Af Amer: >60 mL/min (ref >60)
Glucose, Bld: 99 mg/dL (ref 70–99)
Potassium: 4.1 mmol/L (ref 3.5–5.1)
Sodium: 137 mmol/L (ref 135–145)
Total Bilirubin: 0.9 mg/dL (ref 0.3–1.2)
Total Protein: 7.5 g/dL (ref 6.5–8.1)

## 2019-04-22 LAB — CBC
HCT: 45.1 % (ref 39.0–52.0)
Hemoglobin: 15.6 g/dL (ref 13.0–17.0)
MCH: 30.1 pg (ref 26.0–34.0)
MCHC: 34.6 g/dL (ref 30.0–36.0)
MCV: 87.1 fL (ref 80.0–100.0)
Platelets: 353 10*3/uL (ref 150–400)
RBC: 5.18 MIL/uL (ref 4.22–5.81)
RDW: 12.1 % (ref 11.5–15.5)
WBC: 13.5 10*3/uL — ABNORMAL HIGH (ref 4.0–10.5)
nRBC: 0 % (ref 0.0–0.2)

## 2019-04-22 LAB — RESPIRATORY PANEL BY RT PCR (FLU A&B, COVID)
Influenza A by PCR: NEGATIVE
Influenza B by PCR: NEGATIVE
SARS Coronavirus 2 by RT PCR: NEGATIVE

## 2019-04-22 LAB — URINALYSIS, ROUTINE W REFLEX MICROSCOPIC
Bacteria, UA: NONE SEEN
Bilirubin Urine: NEGATIVE
Glucose, UA: NEGATIVE mg/dL
Hgb urine dipstick: NEGATIVE
Ketones, ur: 20 mg/dL — AB
Leukocytes,Ua: NEGATIVE
Nitrite: NEGATIVE
Protein, ur: 30 mg/dL — AB
Specific Gravity, Urine: 1.03 (ref 1.005–1.030)
pH: 5 (ref 5.0–8.0)

## 2019-04-22 LAB — LIPASE, BLOOD: Lipase: 25 U/L (ref 11–51)

## 2019-04-22 LAB — LACTIC ACID, PLASMA: Lactic Acid, Venous: 1 mmol/L (ref 0.5–1.9)

## 2019-04-22 MED ORDER — ONDANSETRON HCL 4 MG/2ML IJ SOLN
4.0000 mg | Freq: Four times a day (QID) | INTRAMUSCULAR | Status: DC | PRN
  Administered 2019-04-22: 22:00:00 4 mg via INTRAVENOUS
  Filled 2019-04-22: qty 2

## 2019-04-22 MED ORDER — DIPHENHYDRAMINE HCL 50 MG/ML IJ SOLN: 12.5000 mg | Freq: Four times a day (QID) | INTRAMUSCULAR | Status: DC | PRN

## 2019-04-22 MED ORDER — LACTATED RINGERS IV BOLUS
1000.0000 mL | Freq: Once | INTRAVENOUS | Status: AC
  Administered 2019-04-22: 20:00:00 1000 mL via INTRAVENOUS

## 2019-04-22 MED ORDER — ONDANSETRON HCL 4 MG/2ML IJ SOLN
4.0000 mg | Freq: Once | INTRAMUSCULAR | Status: AC
  Administered 2019-04-22: 17:00:00 4 mg via INTRAVENOUS
  Filled 2019-04-22: qty 2

## 2019-04-22 MED ORDER — SODIUM CHLORIDE 0.9% FLUSH
3.0000 mL | Freq: Once | INTRAVENOUS | Status: AC
  Administered 2019-04-22: 17:00:00 3 mL via INTRAVENOUS

## 2019-04-22 MED ORDER — DIATRIZOATE MEGLUMINE & SODIUM 66-10 % PO SOLN
90.0000 mL | Freq: Once | ORAL | Status: AC
  Administered 2019-04-23: 08:00:00 90 mL via ORAL
  Filled 2019-04-22: qty 90

## 2019-04-22 MED ORDER — HEPARIN SODIUM (PORCINE) 5000 UNIT/ML IJ SOLN
5000.0000 [IU] | Freq: Three times a day (TID) | INTRAMUSCULAR | Status: DC
  Administered 2019-04-23 (×2): 5000 [IU] via SUBCUTANEOUS
  Filled 2019-04-22 (×6): qty 1

## 2019-04-22 MED ORDER — LACTATED RINGERS IV SOLN: INTRAVENOUS | Status: DC

## 2019-04-22 MED ORDER — SODIUM CHLORIDE 0.9 % IV BOLUS
1000.0000 mL | Freq: Once | INTRAVENOUS | Status: AC
  Administered 2019-04-22: 17:00:00 1000 mL via INTRAVENOUS

## 2019-04-22 MED ORDER — HYDROMORPHONE HCL 1 MG/ML IJ SOLN: 0.5000 mg | INTRAMUSCULAR | Status: DC | PRN

## 2019-04-22 MED ORDER — DIPHENHYDRAMINE HCL 12.5 MG/5ML PO ELIX: 12.5000 mg | ORAL_SOLUTION | Freq: Four times a day (QID) | ORAL | Status: DC | PRN

## 2019-04-22 MED ORDER — HYDROMORPHONE HCL 1 MG/ML IJ SOLN
1.0000 mg | Freq: Once | INTRAMUSCULAR | Status: AC
  Administered 2019-04-22: 17:00:00 1 mg via INTRAVENOUS
  Filled 2019-04-22: qty 1

## 2019-04-22 MED ORDER — IOHEXOL 300 MG/ML  SOLN
100.0000 mL | Freq: Once | INTRAMUSCULAR | Status: AC | PRN
  Administered 2019-04-22: 17:00:00 100 mL via INTRAVENOUS

## 2019-04-22 MED ORDER — IBUPROFEN 800 MG PO TABS: 800.0000 mg | ORAL_TABLET | Freq: Four times a day (QID) | ORAL | Status: DC | PRN

## 2019-04-22 MED ORDER — ACETAMINOPHEN 325 MG PO TABS
650.0000 mg | ORAL_TABLET | Freq: Four times a day (QID) | ORAL | Status: DC
  Administered 2019-04-23 – 2019-04-26 (×12): 650 mg via ORAL
  Filled 2019-04-22 (×14): qty 2

## 2019-04-22 MED ORDER — ONDANSETRON 4 MG PO TBDP: 4.0000 mg | ORAL_TABLET | Freq: Four times a day (QID) | ORAL | Status: DC | PRN

## 2019-04-22 NOTE — ED Notes (Signed)
Patient transported to CT 

## 2019-04-22 NOTE — ED Triage Notes (Signed)
Pt with diarrhea onset today. C/o epigastric pain and nausea as well. Pt denies fevers.

## 2019-04-22 NOTE — H&P (Signed)
CC: Diarrhea, abdominal cramps/intermittent pains  Requesting provider: Rodell Perna, PA-C  HPI: Darrell Cooper is an 44 y.o. male with hx of GERD whom presents to ED with diarrhea and abdominal cramps which began this morning. He reports the pains he had were worse this morning and concentrated in the mid epigastrium. He specifically denies any emesis. Had some nausea intermittently today. He denies ever having had this before. Denies blood in his stool.  PSH: Denies any prior abdominal surgical hx  Past Medical History:  Diagnosis Date  . GERD (gastroesophageal reflux disease)    H/O  STOMACH ULCER===NO MEDS NOW  . Treadmill stress test negative for angina pectoris    WANTED A BASELINE TEST ---    Past Surgical History:  Procedure Laterality Date  . ANTERIOR CERVICAL DECOMP/DISCECTOMY FUSION  04/04/2012   Procedure: ANTERIOR CERVICAL DECOMPRESSION/DISCECTOMY FUSION 2 LEVELS;  Surgeon: Otilio Connors, MD;  Location: [facility];  Service: Neurosurgery;  Laterality: N/A;  Cervical four-five, five-six Anterior cervical decompression/diskectomy/fusion/lifenet bone/trestle plate  . KNEE SURGERY    . thumb surgery      No family history on file.  Social:  does not have a smoking history on file. His smokeless tobacco use includes snuff. He reports current alcohol use of about 3.0 - 4.0 standard drinks of alcohol per week. He reports that he does not use drugs.  Allergies:  Allergies  Allergen Reactions  . Codeine Anaphylaxis  . Other     codiene    Medications: I have reviewed the patient's current medications.  Results for orders placed or performed during the hospital encounter of 04/22/19 (from the past 48 hour(s))  Lipase, blood     Status: None   Collection Time: 04/22/19  3:38 PM  Result Value Ref Range   Lipase 25 11 - 51 U/L    Comment: Performed at [facility], [place] [address], [place], [place] [address]  Comprehensive metabolic panel     Status: Abnormal    Collection Time: 04/22/19  3:38 PM  Result Value Ref Range   Sodium 137 135 - 145 mmol/L   Potassium 4.1 3.5 - 5.1 mmol/L   Chloride 103 98 - 111 mmol/L   CO2 22 22 - 32 mmol/L   Glucose, Bld 99 70 - 99 mg/dL   BUN 13 6 - 20 mg/dL   Creatinine, Ser 1.14 0.61 - 1.24 mg/dL   Calcium 10.4 (H) 8.9 - 10.3 mg/dL   Total Protein 7.5 6.5 - 8.1 g/dL   Albumin 4.5 3.5 - 5.0 g/dL   AST 27 15 - 41 U/L   ALT 30 0 - 44 U/L   Alkaline Phosphatase 69 38 - 126 U/L   Total Bilirubin 0.9 0.3 - 1.2 mg/dL   GFR calc non Af Amer >60 >60 mL/min   GFR calc Af Amer >60 >60 mL/min   Anion gap 12 5 - 15    Comment: Performed at [facility] [address], [place] [address]  CBC     Status: Abnormal   Collection Time: 04/22/19  3:38 PM  Result Value Ref Range   WBC 13.5 (H) 4.0 - 10.5 K/uL   RBC 5.18 4.22 - 5.81 MIL/uL   Hemoglobin 15.6 13.0 - 17.0 g/dL   HCT 45.1 39.0 - 52.0 %   MCV 87.1 80.0 - 100.0 fL   MCH 30.1 26.0 - 34.0 pg   MCHC 34.6 30.0 - 36.0 g/dL   RDW 12.1  11.5 - 15.5 %   Platelets 353 150 - 400 K/uL   nRBC 0.0 0.0 - 0.2 %    Comment: Performed at [facility], [place] [address] [place], [place] [address]  Urinalysis, Routine w reflex microscopic     Status: Abnormal   Collection Time: 04/22/19  5:15 PM  Result Value Ref Range   Color, Urine YELLOW YELLOW   APPearance HAZY (A) CLEAR   Specific Gravity, Urine 1.030 1.005 - 1.030   pH 5.0 5.0 - 8.0   Glucose, UA NEGATIVE NEGATIVE mg/dL   Hgb urine dipstick NEGATIVE NEGATIVE   Bilirubin Urine NEGATIVE NEGATIVE   Ketones, ur 20 (A) NEGATIVE mg/dL   Protein, ur 30 (A) NEGATIVE mg/dL   Nitrite NEGATIVE NEGATIVE   Leukocytes,Ua NEGATIVE NEGATIVE   Bacteria, UA NONE SEEN NONE SEEN   Mucus PRESENT    Ca Oxalate Crys, UA PRESENT     Comment: Performed at [facility] [address], [place], [place] [address]    CT ABDOMEN PELVIS W CONTRAST  Result Date: 04/22/2019 CLINICAL DATA:  Diarrhea, which began  today. Epigastric pain and nausea, no fevers. EXAM: CT ABDOMEN AND PELVIS WITH CONTRAST TECHNIQUE: Multidetector CT imaging of the abdomen and pelvis was performed using the standard protocol following bolus administration of intravenous contrast. CONTRAST:  157mL OMNIPAQUE IOHEXOL 300 MG/ML  SOLN COMPARISON:  CT abdomen pelvis 04/12/2006 FINDINGS: Lower chest: Lung bases are clear. Normal heart size. No pericardial effusion. Hepatobiliary: No focal liver abnormality is seen. No gallstones, gallbladder wall thickening, or biliary dilatation. Pancreas: Unremarkable. No pancreatic ductal dilatation or surrounding inflammatory changes. Spleen: Normal in size without focal abnormality. Adrenals/Urinary Tract: Adrenal glands are unremarkable. Kidneys are normal, without renal calculi, focal lesion, or hydronephrosis. Urinary bladder is largely decompressed at the time of exam and therefore poorly evaluated by CT imaging. Mild wall thickening likely related to underdistention. Stomach/Bowel: Distal esophagus, stomach and duodenum are unremarkable. There is a localized cluster of air and fluid distended small bowel in the left upper quadrant. Gradual proximal transition point seen in the left upper quadrant (6/38) and a distal transition point seen in the left lower quadrant (3/66). Mild thickening of the affected bowel wall but without abnormal hypoenhancement. Mild hazy comblike stranding of the mesentery is noted. No pneumatosis. No extraluminal fluid or gas. No air within the draining mesenteric veins. More distal small bowel is decompressed. Appendix is not well visualized. No focal pericecal inflammation to suggest occult appendicitis. Colon is mostly decompressed with some fluid in the bowel lumen and a notable absence of formed stool. No colonic dilatation or wall thickening. Vascular/Lymphatic: The aorta is normal caliber. Slight leftward tethering of the SMV as it courses towards the affected bowel loops. Some  mild central mesenteric lymph node prominence is present in this region as well. Reproductive: The prostate and seminal vesicles are unremarkable. Other: No abdominopelvic free fluid or free gas. No bowel containing hernias. Musculoskeletal: No acute osseous abnormality or suspicious osseous lesion. IMPRESSION: 1. Focal cluster of air and fluid distended small bowel in the left upper quadrant with proximal transition point and a distal transition point in the left lower quadrant. Findings are concerning for developing small bowel obstruction, in the absence of abdominal surgical history, could consider other etiologies including potential internal hernia given a slightly deviated appearance of the SMV and central mesenteric nodal prominence. Mild thickening of the bowel wall is nonspecific but does raise some suspicion potential early vascular compromise.  2. Colon is mostly decompressed with some fluid in the bowel lumen and a notable absence of formed stool. Finding compatible with a rapid transit state. This can be seen in early setting bowel obstruction as well. 3. Normal pancreas with normal lipase. These results were called by telephone at the time of interpretation on 04/22/2019 at 5:54 pm to provider [facility] , who verbally acknowledged these results. Electronically Signed   By: Lovena Le M.D.   On: 04/22/2019 17:54    ROS - all of the below systems have been reviewed with the patient and positives are indicated with bold text General: chills, fever or night sweats Eyes: blurry vision or double vision ENT: epistaxis or sore throat Allergy/Immunology: itchy/watery eyes or nasal congestion Hematologic/Lymphatic: bleeding problems, blood clots or swollen lymph nodes Endocrine: temperature intolerance or unexpected weight changes Breast: new or changing breast lumps or nipple discharge Resp: cough, shortness of breath, or wheezing CV: chest pain or dyspnea on exertion GI: as per HPI GU: dysuria,  trouble voiding, or hematuria MSK: joint pain or joint stiffness Neuro: TIA or stroke symptoms Derm: pruritus and skin lesion changes Psych: anxiety and depression  PE Blood pressure (!) 133/94, pulse (!) 103, temperature 98.3 F (36.8 C), temperature source Oral, resp. rate 16, SpO2 97 %. Constitutional: NAD; conversant; no deformities; wearing mask, sitting up in chair. Eyes: Moist conjunctiva; no lid lag; anicteric; pupils equal and round Neck: Trachea midline; no thyromegaly Lungs: Normal respiratory effort; no tactile fremitus CV: RRR; no palpable thrills; no pitting edema GI: Abd soft, mildly distended; mildly ttp in MEG; no tenderness elsewhere; no rebound; no guarding; no palpable hepatosplenomegaly MSK: unable to assess gait; no clubbing/cyanosis Psychiatric: Appropriate affect; alert and oriented x3 Lymphatic: No palpable cervical or axillary lymphadenopathy  Results for orders placed or performed during the hospital encounter of 04/22/19 (from the past 48 hour(s))  Lipase, blood     Status: None   Collection Time: 04/22/19  3:38 PM  Result Value Ref Range   Lipase 25 11 - 51 U/L    Comment: Performed at [facility], [place] [address], [place], [place] [address]  Comprehensive metabolic panel     Status: Abnormal   Collection Time: 04/22/19  3:38 PM  Result Value Ref Range   Sodium 137 135 - 145 mmol/L   Potassium 4.1 3.5 - 5.1 mmol/L   Chloride 103 98 - 111 mmol/L   CO2 22 22 - 32 mmol/L   Glucose, Bld 99 70 - 99 mg/dL   BUN 13 6 - 20 mg/dL   Creatinine, Ser 1.14 0.61 - 1.24 mg/dL   Calcium 10.4 (H) 8.9 - 10.3 mg/dL   Total Protein 7.5 6.5 - 8.1 g/dL   Albumin 4.5 3.5 - 5.0 g/dL   AST 27 15 - 41 U/L   ALT 30 0 - 44 U/L   Alkaline Phosphatase 69 38 - 126 U/L   Total Bilirubin 0.9 0.3 - 1.2 mg/dL   GFR calc non Af Amer >60 >60 mL/min   GFR calc Af Amer >60 >60 mL/min   Anion gap 12 5 - 15    Comment: Performed at [facility] [address]  [place] [address]  CBC     Status: Abnormal   Collection Time: 04/22/19  3:38 PM  Result Value Ref Range   WBC 13.5 (H) 4.0 - 10.5 K/uL   RBC 5.18 4.22 - 5.81 MIL/uL   Hemoglobin 15.6 13.0 -  17.0 g/dL   HCT 45.1 39.0 - 52.0 %   MCV 87.1 80.0 - 100.0 fL   MCH 30.1 26.0 - 34.0 pg   MCHC 34.6 30.0 - 36.0 g/dL   RDW 12.1 11.5 - 15.5 %   Platelets 353 150 - 400 K/uL   nRBC 0.0 0.0 - 0.2 %    Comment: Performed at [facility], [place] [address] [place], [place] [address]  Urinalysis, Routine w reflex microscopic     Status: Abnormal   Collection Time: 04/22/19  5:15 PM  Result Value Ref Range   Color, Urine YELLOW YELLOW   APPearance HAZY (A) CLEAR   Specific Gravity, Urine 1.030 1.005 - 1.030   pH 5.0 5.0 - 8.0   Glucose, UA NEGATIVE NEGATIVE mg/dL   Hgb urine dipstick NEGATIVE NEGATIVE   Bilirubin Urine NEGATIVE NEGATIVE   Ketones, ur 20 (A) NEGATIVE mg/dL   Protein, ur 30 (A) NEGATIVE mg/dL   Nitrite NEGATIVE NEGATIVE   Leukocytes,Ua NEGATIVE NEGATIVE   Bacteria, UA NONE SEEN NONE SEEN   Mucus PRESENT    Ca Oxalate Crys, UA PRESENT     Comment: Performed at [facility] [address], [place], [place] [address]    CT ABDOMEN PELVIS W CONTRAST  Result Date: 04/22/2019 CLINICAL DATA:  Diarrhea, which began today. Epigastric pain and nausea, no fevers. EXAM: CT ABDOMEN AND PELVIS WITH CONTRAST TECHNIQUE: Multidetector CT imaging of the abdomen and pelvis was performed using the standard protocol following bolus administration of intravenous contrast. CONTRAST:  130mL OMNIPAQUE IOHEXOL 300 MG/ML  SOLN COMPARISON:  CT abdomen pelvis 04/12/2006 FINDINGS: Lower chest: Lung bases are clear. Normal heart size. No pericardial effusion. Hepatobiliary: No focal liver abnormality is seen. No gallstones, gallbladder wall thickening, or biliary dilatation. Pancreas: Unremarkable. No pancreatic ductal dilatation or surrounding inflammatory changes. Spleen: Normal in size without  focal abnormality. Adrenals/Urinary Tract: Adrenal glands are unremarkable. Kidneys are normal, without renal calculi, focal lesion, or hydronephrosis. Urinary bladder is largely decompressed at the time of exam and therefore poorly evaluated by CT imaging. Mild wall thickening likely related to underdistention. Stomach/Bowel: Distal esophagus, stomach and duodenum are unremarkable. There is a localized cluster of air and fluid distended small bowel in the left upper quadrant. Gradual proximal transition point seen in the left upper quadrant (6/38) and a distal transition point seen in the left lower quadrant (3/66). Mild thickening of the affected bowel wall but without abnormal hypoenhancement. Mild hazy comblike stranding of the mesentery is noted. No pneumatosis. No extraluminal fluid or gas. No air within the draining mesenteric veins. More distal small bowel is decompressed. Appendix is not well visualized. No focal pericecal inflammation to suggest occult appendicitis. Colon is mostly decompressed with some fluid in the bowel lumen and a notable absence of formed stool. No colonic dilatation or wall thickening. Vascular/Lymphatic: The aorta is normal caliber. Slight leftward tethering of the SMV as it courses towards the affected bowel loops. Some mild central mesenteric lymph node prominence is present in this region as well. Reproductive: The prostate and seminal vesicles are unremarkable. Other: No abdominopelvic free fluid or free gas. No bowel containing hernias. Musculoskeletal: No acute osseous abnormality or suspicious osseous lesion. IMPRESSION: 1. Focal cluster of air and fluid distended small bowel in the left upper quadrant with proximal transition point and a distal transition point in the left lower quadrant. Findings are concerning for developing small bowel obstruction, in the absence of abdominal surgical history, could  consider other etiologies including potential internal hernia given a  slightly deviated appearance of the SMV and central mesenteric nodal prominence. Mild thickening of the bowel wall is nonspecific but does raise some suspicion potential early vascular compromise. 2. Colon is mostly decompressed with some fluid in the bowel lumen and a notable absence of formed stool. Finding compatible with a rapid transit state. This can be seen in early setting bowel obstruction as well. 3. Normal pancreas with normal lipase. These results were called by telephone at the time of interpretation on 04/22/2019 at 5:54 pm to provider [facility] , who verbally acknowledged these results. Electronically Signed   By: Lovena Le M.D.   On: 04/22/2019 17:54   A/P: Darrell Cooper is an 44 y.o. male with hx of GERD presented to ED with diarrhea and MEG pain  AF; HR in upper 90s following crystalloid; normotensive; clinically looks well and in good spirits; he has a reassuring abdominal exam  WBC mildly elevated at 13.5; lactate normal at 1  CT which I have reviewed and also reviewed with my partner Dr. Johney Maine does not show apparent closed loop obstruction, rather a gradual tapering to a dilated segment in LUQ and then tapering to normal caliber again in LLQ which speaks against a closed loop kind of picture  Additionally, he reports significant diarrhea without any emesis which too speaks against and obstructive type picture and favors this to likely be some form of gastroenteritis  I reviewed all of this with Mr. Guhl tonight including the rather vague imaging findings. We discussed the most aggressive option of exploration with surgery and reasonable chance with above, findings that he has no obstructive pathology or further procedure necessary; we also discussed admission for observation and seeing how he does over next day or two and if things were to worsen, consider exploration at that time. He has opted for observation which I agree at this point is the most reasonable option.   Will plan to bring him in for admission NPO MIVF SBO protocol with gastrograffin although given no emesis, will hold off on NG tube placement at this time  Sharon Mt. Dema Severin, M.D. [facility]

## 2019-04-22 NOTE — Progress Notes (Signed)
Pt admitted to 6n33 from ED at this time. Ambulated from hallway to room-gait steady.

## 2019-04-22 NOTE — ED Provider Notes (Signed)
[facility] Provider Note   CSN: [id number] Arrival date & time: 04/22/19  1524     History Chief Complaint  Patient presents with  . Diarrhea  . Abdominal Pain    Darrell Cooper is a 44 y.o. male with history of GERD presents for evaluation of acute onset, progressively worsening epigastric abdominal pain since this morning at 5:30 AM.  Reports the pain awoke him from his sleep.  The pain is constant, waxes and wanes in severity.  It does not radiate.  It worsens with some movements.  He notes he feels bloated and has been belching a lot as well.  He has had nausea but no vomiting.  He has had 10 or more episodes of watery nonbloody diarrhea.  He denies urinary symptoms, fevers, chills, chest pain or shortness of breath.  He ate "minutes takes" for dinner last night.  No one at home has similar symptoms.  He is a non-smoker, denies recreational drug use.  Reports he drinks around 5 beers weekly.  He tried Pepto-Bismol for his symptoms prior to arrival with no relief.  The history is provided by the patient.       Past Medical History:  Diagnosis Date  . GERD (gastroesophageal reflux disease)    H/O  STOMACH ULCER===NO MEDS NOW  . Treadmill stress test negative for angina pectoris    WANTED A BASELINE TEST ---    Patient Active Problem List   Diagnosis Date Noted  . Abdominal pain 04/22/2019    Past Surgical History:  Procedure Laterality Date  . ANTERIOR CERVICAL DECOMP/DISCECTOMY FUSION  04/04/2012   Procedure: ANTERIOR CERVICAL DECOMPRESSION/DISCECTOMY FUSION 2 LEVELS;  Surgeon: Otilio Connors, MD;  Location: [facility];  Service: Neurosurgery;  Laterality: N/A;  Cervical four-five, five-six Anterior cervical decompression/diskectomy/fusion/lifenet bone/trestle plate  . KNEE SURGERY    . thumb surgery         No family history on file.  Social History   Tobacco Use  . Smoking status: Not on file  . Smokeless tobacco:  Current User    Types: Snuff  Substance Use Topics  . Alcohol use: Yes    Alcohol/week: 3.0 - 4.0 standard drinks    Types: 3 - 4 Cans of beer per week  . Drug use: No    Home Medications Prior to Admission medications   Medication Sig Start Date End Date Taking? Authorizing Provider  ibuprofen (ADVIL,MOTRIN) 200 MG tablet Take 800 mg by mouth every 6 (six) hours as needed. For neck pain    [provider]    Allergies    Codeine and Other  Review of Systems   Review of Systems  Constitutional: Negative for chills and fever.  Respiratory: Negative for shortness of breath.   Cardiovascular: Negative for chest pain.  Gastrointestinal: Positive for abdominal pain, diarrhea and nausea. Negative for vomiting.  Genitourinary: Negative for dysuria, frequency, hematuria and urgency.  All other systems reviewed and are negative.   Physical Exam Updated Vital Signs BP (!) 133/94   Pulse (!) 103   Temp 98.3 F (36.8 C) (Oral)   Resp 16   SpO2 97%   Physical Exam Vitals and nursing note reviewed.  Constitutional:      General: He is not in acute distress.    Appearance: He is well-developed.  HENT:     Head: Normocephalic and atraumatic.  Eyes:     General:        Right  eye: No discharge.        Left eye: No discharge.     Conjunctiva/sclera: Conjunctivae normal.  Neck:     Vascular: No JVD.     Trachea: No tracheal deviation.  Cardiovascular:     Rate and Rhythm: Regular rhythm. Tachycardia present.  Pulmonary:     Effort: Pulmonary effort is normal.     Breath sounds: Normal breath sounds.  Abdominal:     General: Abdomen is flat. Bowel sounds are decreased. There is no distension.     Tenderness: There is abdominal tenderness in the right upper quadrant, epigastric area and left upper quadrant. There is no right CVA tenderness, left CVA tenderness, guarding or rebound. Negative signs include Murphy's sign.  Skin:    General: Skin is warm and dry.      Findings: No erythema.  Neurological:     Mental Status: He is alert.  Psychiatric:        Behavior: Behavior normal.     ED Results / Procedures / Treatments   Labs (all labs ordered are listed, but only abnormal results are displayed) Labs Reviewed  COMPREHENSIVE METABOLIC PANEL - Abnormal; Notable for the following components:      Result Value   Calcium 10.4 (*)    All other components within normal limits  CBC - Abnormal; Notable for the following components:   WBC 13.5 (*)    All other components within normal limits  URINALYSIS, ROUTINE W REFLEX MICROSCOPIC - Abnormal; Notable for the following components:   APPearance HAZY (*)    Ketones, ur 20 (*)    Protein, ur 30 (*)    All other components within normal limits  RESPIRATORY PANEL BY RT PCR (FLU A&B, COVID)  LIPASE, BLOOD  LACTIC ACID, PLASMA  LACTIC ACID, PLASMA    EKG EKG Interpretation  Date/Time:  Sunday April 22 2019 15:28:31 EST Ventricular Rate:  123 PR Interval:  128 QRS Duration: 74 QT Interval:  294 QTC Calculation: 420 R Axis:   80 Text Interpretation: Sinus tachycardia Otherwise normal ECG No old tracing to compare Confirmed by Noemi Chapel [phone]) on 04/22/2019 3:37:20 PM   Radiology CT ABDOMEN PELVIS W CONTRAST  Result Date: 04/22/2019 CLINICAL DATA:  Diarrhea, which began today. Epigastric pain and nausea, no fevers. EXAM: CT ABDOMEN AND PELVIS WITH CONTRAST TECHNIQUE: Multidetector CT imaging of the abdomen and pelvis was performed using the standard protocol following bolus administration of intravenous contrast. CONTRAST:  167mL OMNIPAQUE IOHEXOL 300 MG/ML  SOLN COMPARISON:  CT abdomen pelvis 04/12/2006 FINDINGS: Lower chest: Lung bases are clear. Normal heart size. No pericardial effusion. Hepatobiliary: No focal liver abnormality is seen. No gallstones, gallbladder wall thickening, or biliary dilatation. Pancreas: Unremarkable. No pancreatic ductal dilatation or surrounding inflammatory  changes. Spleen: Normal in size without focal abnormality. Adrenals/Urinary Tract: Adrenal glands are unremarkable. Kidneys are normal, without renal calculi, focal lesion, or hydronephrosis. Urinary bladder is largely decompressed at the time of exam and therefore poorly evaluated by CT imaging. Mild wall thickening likely related to underdistention. Stomach/Bowel: Distal esophagus, stomach and duodenum are unremarkable. There is a localized cluster of air and fluid distended small bowel in the left upper quadrant. Gradual proximal transition point seen in the left upper quadrant (6/38) and a distal transition point seen in the left lower quadrant (3/66). Mild thickening of the affected bowel wall but without abnormal hypoenhancement. Mild hazy comblike stranding of the mesentery is noted. No pneumatosis. No extraluminal fluid or gas. No air  within the draining mesenteric veins. More distal small bowel is decompressed. Appendix is not well visualized. No focal pericecal inflammation to suggest occult appendicitis. Colon is mostly decompressed with some fluid in the bowel lumen and a notable absence of formed stool. No colonic dilatation or wall thickening. Vascular/Lymphatic: The aorta is normal caliber. Slight leftward tethering of the SMV as it courses towards the affected bowel loops. Some mild central mesenteric lymph node prominence is present in this region as well. Reproductive: The prostate and seminal vesicles are unremarkable. Other: No abdominopelvic free fluid or free gas. No bowel containing hernias. Musculoskeletal: No acute osseous abnormality or suspicious osseous lesion. IMPRESSION: 1. Focal cluster of air and fluid distended small bowel in the left upper quadrant with proximal transition point and a distal transition point in the left lower quadrant. Findings are concerning for developing small bowel obstruction, in the absence of abdominal surgical history, could consider other etiologies  including potential internal hernia given a slightly deviated appearance of the SMV and central mesenteric nodal prominence. Mild thickening of the bowel wall is nonspecific but does raise some suspicion potential early vascular compromise. 2. Colon is mostly decompressed with some fluid in the bowel lumen and a notable absence of formed stool. Finding compatible with a rapid transit state. This can be seen in early setting bowel obstruction as well. 3. Normal pancreas with normal lipase. These results were called by telephone at the time of interpretation on 04/22/2019 at 5:54 pm to provider [facility] , who verbally acknowledged these results. Electronically Signed   By: Lovena Le M.D.   On: 04/22/2019 17:54    Procedures Procedures (including critical care time)  Medications Ordered in ED Medications  sodium chloride flush (NS) 0.9 % injection 3 mL (3 mLs Intravenous Given 04/22/19 1645)  sodium chloride 0.9 % bolus 1,000 mL (0 mLs Intravenous Stopped 04/22/19 1847)  ondansetron (ZOFRAN) injection 4 mg (4 mg Intravenous Given 04/22/19 1646)  HYDROmorphone (DILAUDID) injection 1 mg (1 mg Intravenous Given 04/22/19 1648)  iohexol (OMNIPAQUE) 300 MG/ML solution 100 mL (100 mLs Intravenous Contrast Given 04/22/19 1728)  lactated ringers bolus 1,000 mL (1,000 mLs Intravenous New Bag/Given 04/22/19 1950)    ED Course  I have reviewed the triage vital signs and the nursing notes.  Pertinent labs & imaging results that were available during my care of the patient were reviewed by me and considered in my medical decision making (see chart for details).    MDM Rules/Calculators/A&P                      Patient presenting for evaluation of sudden onset epigastric abdominal pain with associated nausea and diarrhea.  He is afebrile, initially hypertensive and tachycardic in the ED with improvement after administration of fluids and pain medicine.  He is uncomfortable but nontoxic in appearance.  No  peritoneal signs on examination of the abdomen.  Lab work reviewed by me shows leukocytosis of 13.5, no anemia, no metabolic derangements, no renal insufficiency.  His UA suggest mild dehydration with ketonuria and proteinuria but no evidence of UTI or nephrolithiasis.  Lactic is within normal limits.  CT scan of the abdomen and pelvis shows a focal cluster of air and fluid distended small bowel in the left upper quadrant with proximal transition point in the distal transition point in the left lower quadrant of the abdomen. Spoke with Dr. Marguerita Merles with radiology who is concerned that findings could be suggestive of small bowel  obstruction.  Patient has no history of prior abdominal surgery.  On reevaluation he is resting comfortably, reports his pain has decreased from 9/10 in severity to 4/10 in severity.  He has decreased bowel sounds but they are present on reevaluation.   CONSULT: Spoke with Dr. Dema Severin with general surgery who will assess the patient emergently in the ED.  Dr. Dema Severin will plan to admit the patient to the hospital.  Rapid Covid test is pending. Final Clinical Impression(s) / ED Diagnoses Final diagnoses:  Epigastric pain  Abnormal CT of the abdomen    Rx / DC Orders ED Discharge Orders    None       Debroah Baller 04/22/19 2008    Tegeler, Gwenyth Allegra, MD 04/22/19 2115

## 2019-04-23 ENCOUNTER — Inpatient Hospital Stay (HOSPITAL_COMMUNITY): Payer: Commercial Managed Care - PPO

## 2019-04-23 ENCOUNTER — Encounter (HOSPITAL_COMMUNITY): Payer: Self-pay

## 2019-04-23 LAB — BASIC METABOLIC PANEL
Anion gap: 11 (ref 5–15)
BUN: 10 mg/dL (ref 6–20)
CO2: 23 mmol/L (ref 22–32)
Calcium: 9.9 mg/dL (ref 8.9–10.3)
Chloride: 105 mmol/L (ref 98–111)
Creatinine, Ser: 1.19 mg/dL (ref 0.61–1.24)
GFR calc Af Amer: >60 mL/min (ref >60)
GFR calc non Af Amer: >60 mL/min (ref >60)
Glucose, Bld: 100 mg/dL — ABNORMAL HIGH (ref 70–99)
Potassium: 4.3 mmol/L (ref 3.5–5.1)
Sodium: 139 mmol/L (ref 135–145)

## 2019-04-23 LAB — CBC WITH DIFFERENTIAL/PLATELET
Abs Immature Granulocytes: 0.02 10*3/uL (ref 0.00–0.07)
Basophils Absolute: 0 10*3/uL (ref 0.0–0.1)
Basophils Relative: 0 %
Eosinophils Absolute: 0.2 10*3/uL (ref 0.0–0.5)
Eosinophils Relative: 2 %
HCT: 42 % (ref 39.0–52.0)
Hemoglobin: 14.2 g/dL (ref 13.0–17.0)
Immature Granulocytes: 0 %
Lymphocytes Relative: 8 %
Lymphs Abs: 1 10*3/uL (ref 0.7–4.0)
MCH: 29.8 pg (ref 26.0–34.0)
MCHC: 33.8 g/dL (ref 30.0–36.0)
MCV: 88.2 fL (ref 80.0–100.0)
Monocytes Absolute: 0.6 10*3/uL (ref 0.1–1.0)
Monocytes Relative: 6 %
Neutro Abs: 9.6 10*3/uL — ABNORMAL HIGH (ref 1.7–7.7)
Neutrophils Relative %: 84 %
Platelets: 316 10*3/uL (ref 150–400)
RBC: 4.76 MIL/uL (ref 4.22–5.81)
RDW: 12.3 % (ref 11.5–15.5)
WBC: 11.4 10*3/uL — ABNORMAL HIGH (ref 4.0–10.5)
nRBC: 0 % (ref 0.0–0.2)

## 2019-04-23 LAB — C DIFFICILE QUICK SCREEN W PCR REFLEX
C Diff antigen: NEGATIVE
C Diff interpretation: NOT DETECTED
C Diff toxin: NEGATIVE

## 2019-04-23 LAB — HIV ANTIBODY (ROUTINE TESTING W REFLEX): HIV Screen 4th Generation wRfx: NONREACTIVE

## 2019-04-23 MED ORDER — ALUM & MAG HYDROXIDE-SIMETH 200-200-20 MG/5ML PO SUSP
30.0000 mL | Freq: Four times a day (QID) | ORAL | Status: DC | PRN
  Filled 2019-04-23: qty 30

## 2019-04-23 NOTE — Progress Notes (Addendum)
    CC: Diarrhea, nausea, and epigastric pain  Subjective: No real change since yesterday.  He had 11 loose stools since last evening.  He had symptoms all throughout the day yesterday.  He is a little bit tender in the midepigastric area, the nausea has resolved.  He has 3 children at home ages 37, 62, and 31.  None of whom are sick.  Objective: Vital signs in last 24 hours: Temp:  [98.2 F (36.8 C)-98.8 F (37.1 C)] 98.8 F (37.1 C) (01/18 0544) Pulse Rate:  [84-130] 93 (01/18 0544) Resp:  [16-18] 18 (01/18 0544) BP: (130-156)/(86-118) 130/86 (01/18 0544) SpO2:  [95 %-100 %] 98 % (01/18 0544) Last BM Date: 04/22/19 1000 IV recorded Emesis x1 BM x2 Afebrile vital signs are stable blood pressure was up but is improving. BMP is normal creatinine 1.19.  Lactate 1.0 WBC 11.4 H/H 14.2/42 Respiratory panel is negative Urinalysis is negative   Intake/Output from previous day: 01/17 0701 - 01/18 0700 In: 1000 [IV Piggyback:1000] Out: -  Intake/Output this shift: No intake/output data recorded.  General appearance: alert, cooperative and no distress Resp: clear to auscultation bilaterally GI: Soft some tenderness in the midepigastric area, otherwise exam is negative.  Positive bowel sounds 11 loose stools since last evening.  Lab Results:  Recent Labs    04/22/19 1538 04/23/19 0302  WBC 13.5* 11.4*  HGB 15.6 14.2  HCT 45.1 42.0  PLT 353 316    BMET Recent Labs    04/22/19 1538 04/23/19 0302  NA 137 139  K 4.1 4.3  CL 103 105  CO2 22 23  GLUCOSE 99 100*  BUN 13 10  CREATININE 1.14 1.19  CALCIUM 10.4* 9.9   PT/INR No results for input(s): LABPROT, INR in the last 72 hours.  Recent Labs  Lab 04/22/19 1538  AST 27  ALT 30  ALKPHOS 69  BILITOT 0.9  PROT 7.5  ALBUMIN 4.5     Lipase     Component Value Date/Time   LIPASE 25 04/22/2019 1538     Prior to Admission medications   Medication Sig Start Date End Date Taking? Authorizing Provider   albuterol (VENTOLIN HFA) 108 (90 Base) MCG/ACT inhaler Inhale 1 puff into the lungs every 6 (six) hours as needed for wheezing or shortness of breath.   Yes [provider]  calcium carbonate (TUMS - DOSED IN MG ELEMENTAL CALCIUM) 500 MG chewable tablet Chew 1 tablet by mouth daily as needed for indigestion or heartburn.    Yes [provider]  ibuprofen (ADVIL,MOTRIN) 200 MG tablet Take 800 mg by mouth every 6 (six) hours as needed. For neck pain   Yes [provider]    Medications: . acetaminophen  650 mg Oral Q6H  . heparin  5,000 Units Subcutaneous Q8H   . lactated ringers     Assessment/Plan Hx GERD  Abdominal cramps, nausea and diarrhea Closed-loop vs gastroenteritis  FEN: IV fluids@125  ml/hr - n.p.o. ID: None DVT: Heparin Follow-up: TBD  Plan: He just had the small bowel protocol contrast at 7:45 AM.  So film is pending for later today.  I am going to obtain GI panel and check a C. difficile.  Sips and chips for now, recheck labs in a.m. the C. difficile is negative GI panel is pending.  LOS: 1 day    Reginna Sermeno 04/23/2019 Please see Amion

## 2019-04-23 NOTE — Plan of Care (Signed)

## 2019-04-24 ENCOUNTER — Inpatient Hospital Stay (HOSPITAL_COMMUNITY): Payer: Commercial Managed Care - PPO

## 2019-04-24 LAB — CBC
HCT: 40.6 % (ref 39.0–52.0)
Hemoglobin: 14 g/dL (ref 13.0–17.0)
MCH: 30.4 pg (ref 26.0–34.0)
MCHC: 34.5 g/dL (ref 30.0–36.0)
MCV: 88.1 fL (ref 80.0–100.0)
Platelets: 237 10*3/uL (ref 150–400)
RBC: 4.61 MIL/uL (ref 4.22–5.81)
RDW: 12 % (ref 11.5–15.5)
WBC: 5.8 10*3/uL (ref 4.0–10.5)
nRBC: 0 % (ref 0.0–0.2)

## 2019-04-24 LAB — BASIC METABOLIC PANEL
Anion gap: 9 (ref 5–15)
BUN: 11 mg/dL (ref 6–20)
CO2: 24 mmol/L (ref 22–32)
Calcium: 8.7 mg/dL — ABNORMAL LOW (ref 8.9–10.3)
Chloride: 106 mmol/L (ref 98–111)
Creatinine, Ser: 1.19 mg/dL (ref 0.61–1.24)
GFR calc Af Amer: >60 mL/min (ref >60)
GFR calc non Af Amer: >60 mL/min (ref >60)
Glucose, Bld: 95 mg/dL (ref 70–99)
Potassium: 4.3 mmol/L (ref 3.5–5.1)
Sodium: 139 mmol/L (ref 135–145)

## 2019-04-24 NOTE — Progress Notes (Signed)
    CC: Diarrhea, nausea, and epigastric pain  Subjective: He is feeling fine this AM.  Fewer bowel movements yesterday and last evening.  Abdomen is soft not distended.  Positive bowel sounds.  Objective: Vital signs in last 24 hours: Temp:  [98 F (36.7 C)-98.6 F (37 C)] 98.2 F (36.8 C) (01/19 0629) Pulse Rate:  [79] 79 (01/19 0629) Resp:  [18-20] 18 (01/19 0629) BP: (118-131)/(89-95) 131/95 (01/19 0629) SpO2:  [96 %-98 %] 98 % (01/19 0629) Last BM Date: 04/23/19 480 p.o. 2069 IV Voided x5 BM x3 listed. Afebrile vital signs are stable diastolic blood pressures slightly elevated. Respiratory panel was negative, HIV panel was negative, C. difficile is negative.  GI panel pending noted increasing distention small bowel loops in the left side of the abdomen compared to prior study.  There is a small amount of oral contrast in the descending colon there is some contrast in the bladder the stomach and colon were not distended Intake/Output from previous day: 8-hour SBO protocol film yesterday 01/18 0701 - 01/19 0700 In: 2549.3 [P.O.:480; I.V.:2069.3] Out: 200 [Stool:200] Intake/Output this shift: No intake/output data recorded.  General appearance: alert, cooperative and no distress Resp: clear to auscultation bilaterally GI: soft, non-tender; bowel sounds normal; no masses,  no organomegaly and Fewer loose stools yesterday, but stools still remain liquidy.  Lab Results:  Recent Labs    04/23/19 0302 04/24/19 0309  WBC 11.4* 5.8  HGB 14.2 14.0  HCT 42.0 40.6  PLT 316 237    BMET Recent Labs    04/23/19 0302 04/24/19 0309  NA 139 139  K 4.3 4.3  CL 105 106  CO2 23 24  GLUCOSE 100* 95  BUN 10 11  CREATININE 1.19 1.19  CALCIUM 9.9 8.7*   PT/INR No results for input(s): LABPROT, INR in the last 72 hours.  Recent Labs  Lab 04/22/19 1538  AST 27  ALT 30  ALKPHOS 69  BILITOT 0.9  PROT 7.5  ALBUMIN 4.5     Lipase     Component Value Date/Time   LIPASE 25 04/22/2019 1538     Medications: . acetaminophen  650 mg Oral Q6H  . heparin  5,000 Units Subcutaneous Q8H    Assessment/Plan Hx GERD  Abdominal cramps, nausea and diarrhea Closed-loop vs gastroenteritis  FEN: IV fluids@125  ml/hr - clear liquids ID: None DVT: Heparin Follow-up: TBD  Plan: Continue clears for now.  C. difficile is negative, GI panel is pending.  We will recheck film again in a.m.    LOS: 2 days    Shristi Scheib 04/24/2019 Please see Amion

## 2019-04-25 ENCOUNTER — Inpatient Hospital Stay (HOSPITAL_COMMUNITY): Payer: Commercial Managed Care - PPO

## 2019-04-25 LAB — CBC
HCT: 38 % — ABNORMAL LOW (ref 39.0–52.0)
Hemoglobin: 12.9 g/dL — ABNORMAL LOW (ref 13.0–17.0)
MCH: 29.3 pg (ref 26.0–34.0)
MCHC: 33.9 g/dL (ref 30.0–36.0)
MCV: 86.4 fL (ref 80.0–100.0)
Platelets: 237 10*3/uL (ref 150–400)
RBC: 4.4 MIL/uL (ref 4.22–5.81)
RDW: 11.9 % (ref 11.5–15.5)
WBC: 6.8 10*3/uL (ref 4.0–10.5)
nRBC: 0 % (ref 0.0–0.2)

## 2019-04-25 LAB — COMPREHENSIVE METABOLIC PANEL
ALT: 32 U/L (ref 0–44)
AST: 25 U/L (ref 15–41)
Albumin: 3.5 g/dL (ref 3.5–5.0)
Alkaline Phosphatase: 55 U/L (ref 38–126)
Anion gap: 8 (ref 5–15)
BUN: 7 mg/dL (ref 6–20)
CO2: 25 mmol/L (ref 22–32)
Calcium: 8.4 mg/dL — ABNORMAL LOW (ref 8.9–10.3)
Chloride: 106 mmol/L (ref 98–111)
Creatinine, Ser: 1.21 mg/dL (ref 0.61–1.24)
GFR calc Af Amer: >60 mL/min (ref >60)
GFR calc non Af Amer: >60 mL/min (ref >60)
Glucose, Bld: 96 mg/dL (ref 70–99)
Potassium: 3.8 mmol/L (ref 3.5–5.1)
Sodium: 139 mmol/L (ref 135–145)
Total Bilirubin: 0.5 mg/dL (ref 0.3–1.2)
Total Protein: 6 g/dL — ABNORMAL LOW (ref 6.5–8.1)

## 2019-04-25 NOTE — Progress Notes (Signed)
    CC: Diarrhea, nausea, and epigastric pain  Subjective: Patient says whenever he eats it runs through him short time later.  Still having multiple loose stools.  Not really having abdominal discomfort.  Objective: Vital signs in last 24 hours: Temp:  [97.9 F (36.6 C)-98.3 F (36.8 C)] 98.3 F (36.8 C) (01/20 [id number]) Pulse Rate:  [70-87] 70 (01/20 0633) Resp:  [16-18] 18 (01/20 0633) BP: (118-138)/(84-94) 118/84 (01/20 [id number]) SpO2:  [97 %-100 %] 98 % (01/20 0633) Last BM Date: 04/24/19 1600 p.o. Voided x9 BM x6 Afebrile vital signs are stable Labs are all within normal limits GI panel is still pending Film:  Still some small bowel distension  Intake/Output from previous day: 01/19 0701 - 01/20 0700 In: 1600 [P.O.:1600] Out: -  Intake/Output this shift: No intake/output data recorded.  General appearance: alert, cooperative and no distress Resp: clear to auscultation bilaterally GI: Soft, positive bowel sounds continues to have loose stools.  Lab Results:  Recent Labs    04/24/19 0309 04/25/19 0134  WBC 5.8 6.8  HGB 14.0 12.9*  HCT 40.6 38.0*  PLT 237 237    BMET Recent Labs    04/24/19 0309 04/25/19 0134  NA 139 139  K 4.3 3.8  CL 106 106  CO2 24 25  GLUCOSE 95 96  BUN 11 7  CREATININE 1.19 1.21  CALCIUM 8.7* 8.4*   PT/INR No results for input(s): LABPROT, INR in the last 72 hours.  Recent Labs  Lab 04/22/19 1538 04/25/19 0134  AST 27 25  ALT 30 32  ALKPHOS 69 55  BILITOT 0.9 0.5  PROT 7.5 6.0*  ALBUMIN 4.5 3.5     Lipase     Component Value Date/Time   LIPASE 25 04/22/2019 1538     Medications: . acetaminophen  650 mg Oral Q6H  . heparin  5,000 Units Subcutaneous Q8H    Assessment/Plan Hx GERD Lactate intolerant.  Abdominal cramps, nausea and diarrhea Closed-loop vs gastroenteritis  FEN:IV fluids@125  ml/hr - clear liquids ID: None DVT: Heparin Follow-up: TBD  Plan: He is still having symptoms with recurrent  diarrhea after he eats.  He is lactate intolerant so I am going to try him on a soft diet and see how he does.  GI panel is still pending.       LOS: 3 days    Virga Haltiwanger 04/25/2019 Please see Amion

## 2019-04-25 NOTE — Plan of Care (Signed)
  Problem: Pain Managment: Goal: General experience of comfort will improve Outcome: Progressing   Problem: Safety: Goal: Ability to remain free from injury will improve Outcome: Progressing   Problem: Skin Integrity: Goal: Risk for impaired skin integrity will decrease Outcome: Progressing   

## 2019-04-26 ENCOUNTER — Encounter: Payer: Self-pay | Admitting: Gastroenterology

## 2019-04-26 ENCOUNTER — Inpatient Hospital Stay (HOSPITAL_COMMUNITY): Payer: Commercial Managed Care - PPO

## 2019-04-26 LAB — GI PATHOGEN PANEL BY PCR, STOOL

## 2019-04-26 MED ORDER — CIPROFLOXACIN HCL 500 MG PO TABS
500.0000 mg | ORAL_TABLET | Freq: Two times a day (BID) | ORAL | Status: DC
  Administered 2019-04-26: 08:00:00 500 mg via ORAL
  Filled 2019-04-26: qty 1

## 2019-04-26 MED ORDER — CIPROFLOXACIN HCL 500 MG PO TABS: 500.0000 mg | ORAL_TABLET | Freq: Two times a day (BID) | ORAL | 0 refills | Status: DC

## 2019-04-26 NOTE — Discharge Instructions (Signed)
Enteropathic E. Coli Gastroenteritis, Adult Gastroenteritis is an infection of the intestines. It can cause nausea, vomiting, and other symptoms. Fever usually lasts for 2-3 days, and diarrhea lasts 4-10 days. Most people recover completely, but some people may develop lasting problems, such as arthritis, irritation of the eyes, or painful urination. What are the causes? This condition is caused by  bacteria. These bacteria can spread through food that is not cooked properly, contact with animals that have the bacteria, or contact with a person's stool. You can get this infection by:  Eating food or drinking liquids that have the bacteria.  Drinking polluted standing water.  Coming into contact with an animal that is carrying the bacteria, such as a turtle, bird, snake, or iguana. What increases the risk? This condition is more likely to develop in:  Elderly adults.  People with a weakened disease-fighting system (immune system).  People with poor personal or kitchen hygiene.  People who have contact with animals that are known to carry the bacteria. What are the signs or symptoms? Symptoms of this condition include:  Diarrhea, which may be bloody.  Abdominal pain or cramps.  Fever.  Chills.  Nausea.  Vomiting.  Headache. How is this diagnosed? This condition may be diagnosed based on:  Your symptoms.  Your medical history.  A physical exam.  A blood test.  A stool test. How is this treated? This condition may be managed by:  Drinking plenty of fluids. This is important because this infection can make you lose a lot of fluid (dehydrated).  Taking antibiotic medicines. These may be given if your condition is severe. Medicines may help shorten your illness. Follow these instructions at home: Medicines  Take over-the-counter and prescription medicines only as told by your health care provider.  Do not take medicines to help with diarrhea. These medicines can  make the infection worse.  If you were prescribed an antibiotic medicine, take it as told by your health care provider. Do not stop taking the antibiotic even if you start to feel better. Eating and drinking      Drink enough fluid to keep your urine pale yellow. This helps prevent dehydration.  Drink only clear liquids until your diarrhea, nausea, or vomiting is under control. Clear liquids are liquids you can see through, such as water, broth, fruit juice with added water (diluted fruit juice), low-calorie sports drinks, or non-caffeinated tea.  Take an oral rehydration solution (ORS) as told by your health care provider. This drink is sold at pharmacies and retail stores.  If you are not hungry, do not force yourself to eat.  Eat bland, easy-to-digest foods in small amounts as you are able. These foods include bananas, applesauce, rice, lean meats, toast and crackers.  Avoid: ? Fluids that contain a lot of sugar or caffeine, such as energy drinks, high-calorie sports drinks, and soda. ? Alcohol. ? Foods that are greasy or contain a lot of fat or sugar. ? Spicy foods.  Do not prepare food for others if you have diarrhea. Food safety   Use separate food preparation surfaces and storage spaces for raw meat and for fruits and vegetables.  Keep refrigerated foods colder than 67F (5C).  Serve hot foods immediately or keep them heated above 167F (60C).  Always cook meat, eggs, seafood, and poultry thoroughly.  Do not eat or drink unpasteurized dairy products.  Wash your hands thoroughly after handling or preparing meat, eggs, seafood, and poultry.  Wash your hands, food preparation surfaces,  and utensils thoroughly before and after you handle raw foods.  Wash your hands thoroughly before eating. General instructions  Wash your hands often with soap and water. If soap and water are not available, use hand sanitizer. This helps keep the bacteria from spreading to  others.  Keep track of changes in your weight. Losing a lot of weight can be a sign of a serious problem. Ask your health care provider how much weight loss should concern you.  Stay home from work or school as told by your health care provider.  Keep all follow-up visits as told by your health care provider. This is important. Contact a health care provider if you:  Have a fever.  Have diarrhea that has blood or mucus in it.  Feel weak or dizzy.  Have a headache.  Have urinated only a small amount of very dark urine over 6-8 hours.  Have weight loss.  Have redness, irritation, or pain in your eyes.  Have pain when urinating.  Have swelling or a feeling of warmth in a joint. Get help right away if you:  Cannot keep fluids down.  Cannot stop vomiting or having diarrhea.  Have pain in the abdomen, and the pain gets worse.  Have any symptoms of severe dehydration. These include: ? Not urinating in 6-8 hours. ? Cold and clammy skin. ? Extreme thirst. ? Confusion. ? Rapid breathing. ? Difficulty waking from sleep.  Have changes in vision or loss of vision. Summary  Enteropathic E. Coli gastroenteritis is an infection of the intestines. It can cause nausea, vomiting, and other symptoms. It is caused by salmonella bacteria.  People can get this condition by eating foods or drinking liquids that have the bacteria, or by coming into contact with an animal that is carrying the bacteria.  People with the condition need to drink plenty of fluids to avoid dehydration. Treatment for a severe case may include antibiotic medicines.  Follow your health care provider's instructions for taking medicines, eating and drinking, handling food in a safe way, and calling for help. This information is not intended to replace advice given to you by your health care provider. Make sure you discuss any questions you have with your health care provider. Document Revised: 05/02/2018 Document  Reviewed: 05/05/2017 Elsevier Patient Education  2020 [employer].

## 2019-04-26 NOTE — Progress Notes (Signed)
Pt discharged to home to be picked up by wife.  AVS reviewed, copy given and pt understands.  Reviewed use of antibiotics.  Rx for Cipro to be picked up at [facility] on [address].  No questions about home self care.

## 2019-04-26 NOTE — Discharge Summary (Signed)
Physician Discharge Summary  Patient ID: Darrell Cooper MRN: [id number] DOB/AGE: 04-19-1975 44 y.o.  Admit date: 04/22/2019 Discharge date: 04/26/2019  Admission Diagnoses:  Diarrhea, abdominal cramps, still reasonable intermittent pain Closed-loop obstruction versus gastroenteritis Hx GERD  Discharge Diagnoses:  Enteropathogenic E Coli Gastroenteritis Hx GERD Lactose intolerance  Active Problems:   Abdominal pain   PROCEDURES: None  Hospital Course: Darrell Cooper is an 44 y.o. male with hx of GERD whom presents to ED with diarrhea and abdominal cramps which began this morning. He reports the pains he had were worse this morning and concentrated in the mid epigastrium. He specifically denies any emesis. Had some nausea intermittently today. He denies ever having had this before. Denies blood in his stool.  He was admitted by Dr. Dema Severin and placed on IV fluids and bowel rest.  He continued to have diarrhea and abdominal discomfort.  We checked a C diff, and GI panel. His C diff came back quickly and was negative.  The GI panel did not come back till 04/25/18.  By that time he was tolerating a soft diet and stool was almost back to normal.  He also found out one of his sons also had a similar set of symptoms, but not as bad.  With GI panel back and his improvement we discharged him on 5 days of Cipro.  He can follow up with his PCP as needed.    Condition on DC:  Improved  CBC Latest Ref Rng & Units 04/25/2019 04/24/2019 04/23/2019  WBC 4.0 - 10.5 K/uL 6.8 5.8 11.4(H)  Hemoglobin 13.0 - 17.0 g/dL 12.9(L) 14.0 14.2  Hematocrit 39.0 - 52.0 % 38.0(L) 40.6 42.0  Platelets 150 - 400 K/uL 237 237 316   CMP Latest Ref Rng & Units 04/25/2019 04/24/2019 04/23/2019  Glucose 70 - 99 mg/dL 96 95 100(H)  BUN 6 - 20 mg/dL 7 11 10   Creatinine 0.61 - 1.24 mg/dL 1.21 1.19 1.19  Sodium 135 - 145 mmol/L 139 139 139  Potassium 3.5 - 5.1 mmol/L 3.8 4.3 4.3  Chloride 98 - 111 mmol/L 106 106 105  CO2  22 - 32 mmol/L 25 24 23   Calcium 8.9 - 10.3 mg/dL 8.4(L) 8.7(L) 9.9  Total Protein 6.5 - 8.1 g/dL 6.0(L) - -  Total Bilirubin 0.3 - 1.2 mg/dL 0.5 - -  Alkaline Phos 38 - 126 U/L 55 - -  AST 15 - 41 U/L 25 - -  ALT 0 - 44 U/L 32 - -    Disposition: Discharge disposition: 01-Home or Self Care        Allergies as of 04/26/2019      Reactions   Codeine Anaphylaxis      Medication List    TAKE these medications   albuterol 108 (90 Base) MCG/ACT inhaler Commonly known as: VENTOLIN HFA Inhale 1 puff into the lungs every 6 (six) hours as needed for wheezing or shortness of breath.   calcium carbonate 500 MG chewable tablet Commonly known as: TUMS - dosed in mg elemental calcium Chew 1 tablet by mouth daily as needed for indigestion or heartburn.   ciprofloxacin 500 MG tablet Commonly known as: CIPRO Take 1 tablet (500 mg total) by mouth 2 (two) times daily.   ibuprofen 200 MG tablet Commonly known as: ADVIL Take 800 mg by mouth every 6 (six) hours as needed. For neck pain      Follow-up Information    [facility] Follow up.   Why: call and  let them follow up if you have any issues          Signed: Gerhardt Gleed 04/26/2019, 8:15 AM

## 2019-04-26 NOTE — Progress Notes (Signed)
    CC: Diarrhea, nausea, and epigastric pain  Subjective: He feels good this morning.  His stools returned to normal he is tolerating a soft diet.  Objective: Vital signs in last 24 hours: Temp:  [97.5 F (36.4 C)-97.8 F (36.6 C)] 97.5 F (36.4 C) (01/21 0616) Pulse Rate:  [63-71] 70 (01/21 0616) Resp:  [19] 19 (01/21 0616) BP: (108-120)/(80-92) 120/92 (01/21 0616) SpO2:  [98 %-99 %] 98 % (01/21 0616) Weight:  [88.5 kg] 88.5 kg (01/20 1945) Last BM Date: 04/25/19 900 p.o. soft diet 900 IV Voided x3 1 stool recorded Afebrile vital signs are stable Labs yesterday were normal. GI panel came back this a.m. with neuropathic E. coli. Intake/Output from previous day: 01/20 0701 - 01/21 0700 In: 1800 [P.O.:900; I.V.:900] Out: -  Intake/Output this shift: No intake/output data recorded.  General appearance: alert, cooperative and no distress Resp: clear to auscultation bilaterally GI: soft, non-tender; bowel sounds normal; no masses,  no organomegaly and Bowel movements are returning to normal.  Lab Results:  Recent Labs    04/24/19 0309 04/25/19 0134  WBC 5.8 6.8  HGB 14.0 12.9*  HCT 40.6 38.0*  PLT 237 237    BMET Recent Labs    04/24/19 0309 04/25/19 0134  NA 139 139  K 4.3 3.8  CL 106 106  CO2 24 25  GLUCOSE 95 96  BUN 11 7  CREATININE 1.19 1.21  CALCIUM 8.7* 8.4*   PT/INR No results for input(s): LABPROT, INR in the last 72 hours.  Recent Labs  Lab 04/22/19 1538 04/25/19 0134  AST 27 25  ALT 30 32  ALKPHOS 69 55  BILITOT 0.9 0.5  PROT 7.5 6.0*  ALBUMIN 4.5 3.5     Lipase     Component Value Date/Time   LIPASE 25 04/22/2019 1538     Medications: . acetaminophen  650 mg Oral Q6H  . heparin  5,000 Units Subcutaneous Q8H   Plesiomonas shigelloides NOT DETECTED NOT DETECTED   Yersinia enterocolitica NOT DETECTED NOT DETECTED   Vibrio NOT DETECTED NOT DETECTED   Enteropathogenic E coli NOT DETECTED DETECTEDAbnormal    E coli (ETEC)  LT/ST NOT DETECTED NOT DETECTED   E coli 0157 by PCR NOT DETECTED Not applicable   Cryptosporidium by PCR NOT DETECTED NOT DETECTED   Entamoeba histolytica NOT DETECTED NOT DETECTED   Adenovirus F 40/41 NOT DETECTED NOT DETECTED   Norovirus GI/GII NOT DETECTED NOT DETECTED   Sapovirus NOT DETECTED NOT DETECTED     Assessment/Plan Hx GERD Lactate intolerant.  Abdominal cramps, nausea and diarrhea Closed-loop vs gastroenteritis  -Enteropathic E. Coli/ GI panel  FEN:IV fluids@125  ml/hr - clear liquids ID: None DVT: Heparin Follow-up: TBD  Plan: Reviewed with Dr. Brantley Stage will send him home with 5 days of Cipro and follow-up with his PCP if there is any further problems.  He now tells me one of his sons is also had some diarrhea but did not tell them until after his father went to the hospital..      LOS: 4 days    Sequoia Witz 04/26/2019 Please see Amion

## 2019-05-07 ENCOUNTER — Other Ambulatory Visit: Payer: Self-pay

## 2019-05-07 ENCOUNTER — Encounter: Payer: Self-pay | Admitting: Nurse Practitioner

## 2019-05-07 ENCOUNTER — Ambulatory Visit (INDEPENDENT_AMBULATORY_CARE_PROVIDER_SITE_OTHER): Payer: Commercial Managed Care - PPO | Admitting: Nurse Practitioner

## 2019-05-07 VITALS — BP 114/78 | HR 104 | Temp 98.3°F | Ht 73.0 in | Wt 210.0 lb

## 2019-05-07 DIAGNOSIS — Z1211 Encounter for screening for malignant neoplasm of colon: Secondary | ICD-10-CM | POA: Diagnosis not present

## 2019-05-07 DIAGNOSIS — Z01818 Encounter for other preprocedural examination: Secondary | ICD-10-CM

## 2019-05-07 DIAGNOSIS — A498 Other bacterial infections of unspecified site: Secondary | ICD-10-CM | POA: Diagnosis not present

## 2019-05-07 DIAGNOSIS — K219 Gastro-esophageal reflux disease without esophagitis: Secondary | ICD-10-CM | POA: Diagnosis not present

## 2019-05-07 MED ORDER — NA SULFATE-K SULFATE-MG SULF 17.5-3.13-1.6 GM/177ML PO SOLN: 1.0000 | Freq: Once | ORAL | 0 refills | Status: AC

## 2019-05-07 NOTE — Progress Notes (Signed)
ASSESSMENT / PLAN:   71. 44 yo male hospitalized in January with Enteropathogenic E.coli after presenting with severe diarrhea / SBO. No history of abdominal surgeries. SBO most likely reactive. Symptoms have resolved but BMs not yet back to normal. No diarrhea but having 3-4 soft, non-bloody BMs a day.  -expect bowel movements to normalize within a few weeks as he repopulates his normal intestinal flora   2. Colon cancer screening. Patient will be 44 yo next month, he is age for screening based on new guidelines.  -The risks and benefits of colonoscopy with possible polypectomy / biopsies were discussed and the patient agrees to proceed.   3. GERD. Typically able to manage symptoms by avoiding trigger foods but have regurgitation / heartburn since recent gastroenteritis.  -Though Tums help he is taking them about 3 times a day. Recommend trial of Pepcid 20 mg qam over the next month then try to discontinue and see if able to manage with diet alone as before.  -Anti-reflux literature given.    HPI:    Referring Provider:    "Someone at the hospital "     Chief Complaint:   Follow up at hospitalization for bowel obstruction   44 yo male hospitalized mid January with diarrhea secondary to Enteropathogenic E.Coli, thinks possibly related to consumption of slaw. He presented with severe non-bloody diarrhea, had ~ 40 episodes of diarrhea within a few day periods. Had only one episode of vomiting. WBC was 13.5.   CT scan remarkable for a/f levels in distended small bowel in LUQ with proximal transition point and a distal transition point in LLQ concerning for developing SBO. Mild , nonspecific bowel wall thickening    Symptoms improved, discharged home lon low residue diet and antibiotics which he completed. Still having frequent BMs, mainly postprandial but stool is soft, not diarrhea.  Trying to eat light, limited dairy. No further cramping, nausea or vomiting.   No other  GI complaints except he gives a longstanding history of GERD. Generally able to manage symptoms by diet but since having recent illness he has been frequently symptomatic with heartburn and regurgitation  Regurgitates in supine position, has heartburn throughout the day. May have symptoms daily for a week and then may have no symptoms for a week. Tums help . He eats an early dinner, goes to bed on empty stomach  Data Reviewed:   04/25/19 Hgb 12.9 <<< 14 Normal WBC and platelets Cr 1.21, liver tests normal.    Past Medical History:  Diagnosis Date  . GERD (gastroesophageal reflux disease)    H/O  STOMACH ULCER===NO MEDS NOW  . Treadmill stress test negative for angina pectoris    WANTED A BASELINE TEST ---     Past Surgical History:  Procedure Laterality Date  . ANTERIOR CERVICAL DECOMP/DISCECTOMY FUSION  04/04/2012   Procedure: ANTERIOR CERVICAL DECOMPRESSION/DISCECTOMY FUSION 2 LEVELS;  Surgeon: Otilio Connors, MD;  Location: [facility];  Service: Neurosurgery;  Laterality: N/A;  Cervical four-five, five-six Anterior cervical decompression/diskectomy/fusion/lifenet bone/trestle plate  . KNEE SURGERY    . thumb surgery     Family History  Problem Relation Age of Onset  . Colon cancer Neg Hx   . Stomach cancer Neg Hx   . Esophageal cancer Neg Hx   . Pancreatic cancer Neg Hx    Social History   Tobacco Use  . Smoking status: Current Every Day  Smoker    Types: Cigarettes  . Smokeless tobacco: Current User    Types: Snuff  Substance Use Topics  . Alcohol use: Yes    Alcohol/week: 3.0 - 4.0 standard drinks    Types: 3 - 4 Cans of beer per week  . Drug use: No   Current Outpatient Medications  Medication Sig Dispense Refill  . calcium carbonate (TUMS EX) 750 MG chewable tablet Chew 1 tablet by mouth daily. Tums as needed     No current facility-administered medications for this visit.   Allergies  Allergen Reactions  . Codeine Anaphylaxis     Review of  Systems: Positive for back pain, cough. All other systems reviewed and negative except where noted in HPI.   Serum creatinine: 1.21 mg/dL 04/25/19 0134 Estimated creatinine clearance: 88 mL/min   Physical Exam:    Wt Readings from Last 3 Encounters:  05/07/19 95.3 kg  04/25/19 88.5 kg  03/27/12 89 kg    BP 114/78   Pulse (!) 104   Temp 98.3 F (36.8 C)   Ht 6\' 1"  (1.854 m)   Wt 95.3 kg   BMI 27.71 kg/m  Constitutional:  Pleasant male in no acute distress. Psychiatric: Normal mood and affect. Behavior is normal. EENT: Pupils normal.  Conjunctivae are normal. No scleral icterus. Neck supple.  Cardiovascular: Normal rate, regular rhythm. No edema Pulmonary/chest: Effort normal and breath sounds normal. No wheezing, rales or rhonchi. Abdominal: Soft, nondistended, nontender. Bowel sounds active throughout. There are no masses palpable. No hepatomegaly. Neurological: Alert and oriented to person place and time. Skin: Skin is warm and dry. No rashes noted.  Tye Savoy, NP  05/07/2019, 8:40 AM   No ref. provider found

## 2019-05-07 NOTE — Patient Instructions (Addendum)
If you are age 45 or older, your body mass index should be between 23-30. Your Body mass index is 27.71 kg/m. If this is out of the aforementioned range listed, please consider follow up with your Primary Care Provider.  If you are age 54 or younger, your body mass index should be between 19-25. Your Body mass index is 27.71 kg/m. If this is out of the aformentioned range listed, please consider follow up with your Primary Care Provider.   You have been scheduled for a colonoscopy. Please follow written instructions given to you at your visit today.  Please pick up your prep supplies at the pharmacy within the next 1-3 days. If you use inhalers (even only as needed), please bring them with you on the day of your procedure.  Try Pepcid 20 mg - every morning for 30 days then discontinue.  This is over-the-counter.  You have been given GERD literature.  Follow up as needed.  Thank you for choosing me and [facility].   Tye Savoy, NP

## 2019-05-14 ENCOUNTER — Other Ambulatory Visit: Payer: Self-pay

## 2019-05-14 ENCOUNTER — Telehealth: Payer: Self-pay | Admitting: Gastroenterology

## 2019-05-14 ENCOUNTER — Other Ambulatory Visit: Payer: Self-pay | Admitting: Gastroenterology

## 2019-05-14 ENCOUNTER — Ambulatory Visit (INDEPENDENT_AMBULATORY_CARE_PROVIDER_SITE_OTHER): Payer: Commercial Managed Care - PPO

## 2019-05-14 DIAGNOSIS — Z1159 Encounter for screening for other viral diseases: Secondary | ICD-10-CM

## 2019-05-14 MED ORDER — NA SULFATE-K SULFATE-MG SULF 17.5-3.13-1.6 GM/177ML PO SOLN: 1.0000 | Freq: Once | ORAL | 0 refills | Status: AC

## 2019-05-14 NOTE — Progress Notes (Signed)
Reviewed and agree with documentation and assessment and plan. K. Veena Yancarlos Berthold , MD   

## 2019-05-14 NOTE — Telephone Encounter (Signed)
Prep was re-sent to [facility] on [address]

## 2019-05-14 NOTE — Telephone Encounter (Signed)
Pt states that [facility] has not received prescription for prep. He requests to make sure that it is sent to [facility] on [address] in [place] not [facility] in [place]. He states that most of his prescriptions get sent there.

## 2019-05-15 LAB — SARS CORONAVIRUS 2 (TAT 6-24 HRS): SARS Coronavirus 2: NEGATIVE

## 2019-05-16 ENCOUNTER — Ambulatory Visit (AMBULATORY_SURGERY_CENTER): Payer: Commercial Managed Care - PPO | Admitting: Gastroenterology

## 2019-05-16 ENCOUNTER — Encounter: Payer: Self-pay | Admitting: Gastroenterology

## 2019-05-16 ENCOUNTER — Other Ambulatory Visit: Payer: Self-pay

## 2019-05-16 VITALS — BP 117/77 | HR 63 | Temp 97.7°F | Resp 13 | Ht 73.0 in | Wt 210.0 lb

## 2019-05-16 DIAGNOSIS — Z1211 Encounter for screening for malignant neoplasm of colon: Secondary | ICD-10-CM

## 2019-05-16 DIAGNOSIS — D123 Benign neoplasm of transverse colon: Secondary | ICD-10-CM

## 2019-05-16 DIAGNOSIS — K648 Other hemorrhoids: Secondary | ICD-10-CM | POA: Diagnosis not present

## 2019-05-16 DIAGNOSIS — R194 Change in bowel habit: Secondary | ICD-10-CM

## 2019-05-16 DIAGNOSIS — K635 Polyp of colon: Secondary | ICD-10-CM

## 2019-05-16 MED ORDER — SODIUM CHLORIDE 0.9 % IV SOLN: 500.0000 mL | Freq: Once | INTRAVENOUS | Status: DC

## 2019-05-16 NOTE — Op Note (Signed)
[facility] Patient Name: Darrell Cooper Procedure Date: 05/16/2019 11:58 AM MRN: [id number] Endoscopist: Mauri Pole , MD Age: 44 Referring MD:  Date of Birth: [birth year] Gender: Male Account #: [id number] Procedure:                Colonoscopy Indications:              Screening for colorectal malignant neoplasm,                            Incidental - Change in bowel habits Procedure:                Pre-Anesthesia Assessment:                           - Prior to the procedure, a History and Physical                            was performed, and patient medications and                            allergies were reviewed. The patient's tolerance of                            previous anesthesia was also reviewed. The risks                            and benefits of the procedure and the sedation                            options and risks were discussed with the patient.                            All questions were answered, and informed consent                            was obtained. Prior Anticoagulants: The patient has                            taken no previous anticoagulant or antiplatelet                            agents. ASA Grade Assessment: II - A patient with                            mild systemic disease. After reviewing the risks                            and benefits, the patient was deemed in                            satisfactory condition to undergo the procedure.                           After obtaining informed consent, the colonoscope  was passed under direct vision. Throughout the                            procedure, the patient's blood pressure, pulse, and                            oxygen saturations were monitored continuously. The                            Colonoscope was introduced through the anus and                            advanced to the the cecum, identified by   appendiceal orifice and ileocecal valve. The                            colonoscopy was performed without difficulty. The                            patient tolerated the procedure well. The quality                            of the bowel preparation was adequate. The                            ileocecal valve, appendiceal orifice, and rectum                            were photographed. Scope In: 12:08:34 PM Scope Out: 12:20:24 PM Scope Withdrawal Time: 0 hours 9 minutes 30 seconds  Total Procedure Duration: 0 hours 11 minutes 50 seconds  Findings:                 The perianal and digital rectal examinations were                            normal.                           A 3 mm polyp was found in the transverse colon. The                            polyp was sessile. The polyp was removed with a                            cold snare. Resection and retrieval were complete.                           Non-bleeding internal hemorrhoids were found during                            retroflexion. The hemorrhoids were small.                           The exam was otherwise without abnormality. Complications:  No immediate complications. Estimated Blood Loss:     Estimated blood loss was minimal. Impression:               - One 3 mm polyp in the transverse colon, removed                            with a cold snare. Resected and retrieved.                           - Non-bleeding internal hemorrhoids.                           - The examination was otherwise normal. Recommendation:           - Patient has a contact number available for                            emergencies. The signs and symptoms of potential                            delayed complications were discussed with the                            patient. Return to normal activities tomorrow.                            Written discharge instructions were provided to the                            patient.                            - Resume previous diet.                           - Continue present medications.                           - Await pathology results.                           - Repeat colonoscopy in 5-10 years for surveillance                            based on pathology results.                           - Return to GI clinic PRN. Mauri Pole, MD 05/16/2019 12:25:43 PM This report has been signed electronically.

## 2019-05-16 NOTE — Progress Notes (Signed)
To PACU, VSS. Report to [email or web address] 

## 2019-05-16 NOTE — Progress Notes (Signed)
Called to room to assist during endoscopic procedure.  Patient ID and intended procedure confirmed with present staff. Received instructions for my participation in the procedure from the performing physician.  

## 2019-05-16 NOTE — Patient Instructions (Addendum)
YOU HAD AN ENDOSCOPIC PROCEDURE TODAY AT THE [facility]:   Refer to the procedure report that was given to you for any specific questions about what was found during the examination.  If the procedure report does not answer your questions, please call your gastroenterologist to clarify.  If you requested that your care partner not be given the details of your procedure findings, then the procedure report has been included in a sealed envelope for you to review at your convenience later.  YOU SHOULD EXPECT: Some feelings of bloating in the abdomen. Passage of more gas than usual.  Walking can help get rid of the air that was put into your GI tract during the procedure and reduce the bloating. If you had a lower endoscopy (such as a colonoscopy or flexible sigmoidoscopy) you may notice spotting of blood in your stool or on the toilet paper. If you underwent a bowel prep for your procedure, you may not have a normal bowel movement for a few days.  Please Note:  You might notice some irritation and congestion in your nose or some drainage.  This is from the oxygen used during your procedure.  There is no need for concern and it should clear up in a day or so.  SYMPTOMS TO REPORT IMMEDIATELY:   Following lower endoscopy (colonoscopy or flexible sigmoidoscopy):  Excessive amounts of blood in the stool  Significant tenderness or worsening of abdominal pains  Swelling of the abdomen that is new, acute  Fever of 100F or higher   For urgent or emergent issues, a gastroenterologist can be reached at any hour by calling [phone].   DIET:  We do recommend a small meal at first, but then you may proceed to your regular diet.  Drink plenty of fluids but you should avoid alcoholic beverages for 24 hours.  ACTIVITY:  You should plan to take it easy for the rest of today and you should NOT DRIVE or use heavy machinery until tomorrow (because of the sedation medicines used during the test).     FOLLOW UP: Our staff will call the number listed on your records 48-72 hours following your procedure to check on you and address any questions or concerns that you may have regarding the information given to you following your procedure. If we do not reach you, we will leave a message.  We will attempt to reach you two times.  During this call, we will ask if you have developed any symptoms of COVID 19. If you develop any symptoms (ie: fever, flu-like symptoms, shortness of breath, cough etc.) before then, please call [phone].  If you test positive for Covid 19 in the 2 weeks post procedure, please call and report this information to us.    If any biopsies were taken you will be contacted by phone or by letter within the next 1-3 weeks.  Please call us at [phone] if you have not heard about the biopsies in 3 weeks.    SIGNATURES/CONFIDENTIALITY: You and/or your care partner have signed paperwork which will be entered into your electronic medical record.  These signatures attest to the fact that that the information above on your After Visit Summary has been reviewed and is understood.  Full responsibility of the confidentiality of this discharge information lies with you and/or your care-partner.   Thank you for allowing us to provide your healthcare today.   

## 2019-05-16 NOTE — Progress Notes (Signed)
Temp-LC VS-KA

## 2019-05-18 ENCOUNTER — Telehealth: Payer: Self-pay | Admitting: *Deleted

## 2019-05-18 ENCOUNTER — Telehealth: Payer: Self-pay

## 2019-05-18 NOTE — Telephone Encounter (Signed)
LVM

## 2019-05-18 NOTE — Telephone Encounter (Signed)
  Follow up Call-  Call back number 05/16/2019  Post procedure Call Back phone  # [phone]  Permission to leave phone message Yes  Some recent data might be hidden     Patient questions:  Phone  #2rang then we were disconnected.  Second call.

## 2019-05-24 ENCOUNTER — Encounter: Payer: Self-pay | Admitting: Gastroenterology

## 2019-05-29 ENCOUNTER — Ambulatory Visit: Payer: Commercial Managed Care - PPO | Admitting: Gastroenterology

## 2021-08-10 ENCOUNTER — Ambulatory Visit (INDEPENDENT_AMBULATORY_CARE_PROVIDER_SITE_OTHER): Payer: Commercial Managed Care - PPO

## 2021-08-10 ENCOUNTER — Ambulatory Visit: Payer: Commercial Managed Care - PPO | Admitting: Podiatry

## 2021-08-10 ENCOUNTER — Encounter: Payer: Self-pay | Admitting: Podiatry

## 2021-08-10 DIAGNOSIS — L723 Sebaceous cyst: Secondary | ICD-10-CM

## 2021-08-10 DIAGNOSIS — M67472 Ganglion, left ankle and foot: Secondary | ICD-10-CM

## 2021-08-10 MED ORDER — BETAMETHASONE SOD PHOS & ACET 6 (3-3) MG/ML IJ SUSP
3.0000 mg | Freq: Once | INTRAMUSCULAR | Status: AC
  Administered 2021-08-10: 3 mg via INTRA_ARTICULAR

## 2021-08-10 NOTE — Progress Notes (Signed)
? ?  HPI: 46 y.o. male presenting today as a new patient for evaluation of left dorsal foot pain that began about 3 weeks ago.  Sudden onset when he kicked a football.  He noticed a lump developed to the dorsum of the foot.  It is very tender and painful.  He did take an oral prednisone pack from his PCP and he has been icing his foot and taking Advil.  He presents for further treatment and evaluation ? ?Past Medical History:  ?Diagnosis Date  ? GERD (gastroesophageal reflux disease)   ? SBO (small bowel obstruction) ([facility])   ? ecoli infection  ? Treadmill stress test negative for angina pectoris   ? WANTED A BASELINE TEST ---  ? ? ?Past Surgical History:  ?Procedure Laterality Date  ? ANTERIOR CERVICAL DECOMP/DISCECTOMY FUSION  04/04/2012  ? Procedure: ANTERIOR CERVICAL DECOMPRESSION/DISCECTOMY FUSION 2 LEVELS;  Surgeon: Otilio Connors, MD;  Location: [facility];  Service: Neurosurgery;  Laterality: N/A;  Cervical four-five, five-six Anterior cervical decompression/diskectomy/fusion/lifenet bone/trestle plate  ? KNEE SURGERY    ? thumb surgery    ? ? ?Allergies  ?Allergen Reactions  ? Codeine Anaphylaxis  ? ?  ?Physical Exam: ?General: The patient is alert and oriented x3 in no acute distress. ? ?Dermatology: Skin is warm, dry and supple bilateral lower extremities. Negative for open lesions or macerations. ? ?Vascular: Palpable pedal pulses bilaterally. Capillary refill within normal limits.  Negative for any significant edema or erythema ? ?Neurological: Light touch and protective threshold grossly intact ? ?Musculoskeletal Exam: Palpable tender nonadhered fluctuant nodule noted to the dorsum of the left foot overlying the EHL tendon consistent with a ganglion cyst. ? ?Radiographic Exam:  ?Normal osseous mineralization. Joint spaces preserved. No fracture/dislocation/boney destruction.   ? ?Assessment: ?1.  Ganglion cyst left foot overlying the EHL ? ? ?Plan of Care:  ?1. Patient evaluated. X-Rays reviewed.  ?2.   Injection of 0.5 cc Celestone Soluspan in combination with 1 mL of 2% lidocaine plain injected along the ganglion cyst area left.  The soft tissue in the area was massaged and pressure was applied and the area after anesthesia set in and the ganglion cyst lesion was ruptured and resorbed into the surrounding tissue ?3.  Continue OTC Motrin as needed ?4.  Recommend good supportive shoes and sneakers ?5.  Return to clinic in 4 weeks for follow-up ? 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 ?  ?  ?Edrick Kins, DPM ?[place] ? ?Dr. Edrick Kins, DPM  ?  ?[address]                                        ?[place], [place] [address]                ?Office [phone]  ?Fax [phone] ? ? ? ? ?

## 2021-09-02 ENCOUNTER — Ambulatory Visit (INDEPENDENT_AMBULATORY_CARE_PROVIDER_SITE_OTHER): Payer: Commercial Managed Care - PPO | Admitting: Podiatry

## 2021-09-02 DIAGNOSIS — M67472 Ganglion, left ankle and foot: Secondary | ICD-10-CM | POA: Diagnosis not present

## 2021-09-07 MED ORDER — BETAMETHASONE SOD PHOS & ACET 6 (3-3) MG/ML IJ SUSP: 3.0000 mg | Freq: Once | INTRAMUSCULAR | Status: DC

## 2021-09-07 NOTE — Progress Notes (Signed)
   HPI: 46 y.o. male presenting today for follow-up evaluation of a recurrent ganglion cyst to the dorsum of the left foot which began about 6-8 weeks ago.  Sudden onset when he kicked a football.  He noticed a lump developed to the dorsum of the foot.  Last visit on 08/10/2021 injection was administered and the cyst was ruptured and resorbed into the surrounding tissues.  Unfortunately the cyst has returned and it is very tender and painful.    Past Medical History:  Diagnosis Date   GERD (gastroesophageal reflux disease)    SBO (small bowel obstruction) ([facility])    ecoli infection   Treadmill stress test negative for angina pectoris    WANTED A BASELINE TEST ---    Past Surgical History:  Procedure Laterality Date   ANTERIOR CERVICAL DECOMP/DISCECTOMY FUSION  04/04/2012   Procedure: ANTERIOR CERVICAL DECOMPRESSION/DISCECTOMY FUSION 2 LEVELS;  Surgeon: Otilio Connors, MD;  Location: [facility];  Service: Neurosurgery;  Laterality: N/A;  Cervical four-five, five-six Anterior cervical decompression/diskectomy/fusion/lifenet bone/trestle plate   KNEE SURGERY     thumb surgery      Allergies  Allergen Reactions   Codeine Anaphylaxis     Physical Exam: General: The patient is alert and oriented x3 in no acute distress.  Dermatology: Skin is warm, dry and supple bilateral lower extremities. Negative for open lesions or macerations.  Vascular: Palpable pedal pulses bilaterally. Capillary refill within normal limits.  Negative for any significant edema or erythema  Neurological: Light touch and protective threshold grossly intact  Musculoskeletal Exam: Palpable tender nonadhered fluctuant nodule noted to the dorsum of the left foot overlying the EHL tendon consistent with a ganglion cyst.  Radiographic Exam:  Normal osseous mineralization. Joint spaces preserved. No fracture/dislocation/boney destruction.    Assessment: 1.  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 Patient evaluated. X-Rays reviewed.  2.  Injection of 0.5 cc Celestone Soluspan in combination with 1 mL of 2% lidocaine plain injected along the ganglion cyst area left.  Again, pressure was applied to the area and the ganglion cyst was massaged and resorbed back into the tissues. 3.  Unfortunately this is the second recurrence of the ganglion cyst to the left foot.  Informed the patient that if he does have a return of the ganglion cyst we may need to proceed with surgical excision of the lesion.  Patient understands and agrees. 4.  We will simply observe for now.  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 Edrick Kins, DPM    [address],  [address]                Office [phone]  Fax [phone]

## 2021-09-23 ENCOUNTER — Ambulatory Visit: Payer: Commercial Managed Care - PPO | Admitting: Podiatry

## 2021-09-23 DIAGNOSIS — M67472 Ganglion, left ankle and foot: Secondary | ICD-10-CM

## 2021-09-23 NOTE — Progress Notes (Signed)
HPI: 46 y.o. male presenting today again for follow-up evaluation of a recurrent ganglion cyst to the dorsum of the left foot.  Original ganglion cyst developed suddenly about 2-3 months ago after kicking a football.  He noticed a lump developed to the dorsum of the foot.  Patient originally seen on 08/10/2021 at which time steriod injection was administered and the cyst was ruptured and resorbed into the surrounding tissues.   Patient returned to the office on 09/02/2021 after redevelopment of the ganglion cyst.  This time the cyst was directly overlying the EHL tendon.  Again, injection was administered and pressure was applied to the area and the ganglion cyst was massaged and restore back into the tissues. Patient presents today with redevelopment of a ganglion cyst however in a different area of the dorsum of the foot.  This ganglion cyst appears to be located more at the level of the second TMT joint on the same foot.  He says that it is very irritating especially in his work boots and he is frustrated that it has redeveloped 3 different times in 3 different locations on the dorsum of the foot.  Past Medical History:  Diagnosis Date   GERD (gastroesophageal reflux disease)    SBO (small bowel obstruction) ([facility])    ecoli infection   Treadmill stress test negative for angina pectoris    WANTED A BASELINE TEST ---    Past Surgical History:  Procedure Laterality Date   ANTERIOR CERVICAL DECOMP/DISCECTOMY FUSION  04/04/2012   Procedure: ANTERIOR CERVICAL DECOMPRESSION/DISCECTOMY FUSION 2 LEVELS;  Surgeon: Otilio Connors, MD;  Location: [facility];  Service: Neurosurgery;  Laterality: N/A;  Cervical four-five, five-six Anterior cervical decompression/diskectomy/fusion/lifenet bone/trestle plate   KNEE SURGERY     thumb surgery      Allergies  Allergen Reactions   Codeine Anaphylaxis     Physical Exam: General: The patient is alert and oriented x3 in no acute distress.  Dermatology:  Skin is warm, dry and supple bilateral lower extremities. Negative for open lesions or macerations.  Vascular: Palpable pedal pulses bilaterally. Capillary refill within normal limits.  Negative for any significant edema or erythema  Neurological: Light touch and protective threshold grossly intact  Musculoskeletal Exam: Palpable tender nonadhered fluctuant nodule noted to the dorsum of the left foot overlying the EHL tendon consistent with a ganglion cyst.  Radiographic Exam LT foot 08/10/2021:  Normal osseous mineralization. Joint spaces preserved. No fracture/dislocation/boney destruction.  Otherwise normal exam  Assessment: 1.  Recurrent ganglion cyst left foot.  3 separate occasions in 3 different locations on the dorsum of the foot   Plan of Care:  1. Patient evaluated.  2.  Injection of 0.5 cc Celestone Soluspan in combination with 1 mL of 2% lidocaine plain injected along the ganglion cyst area left.  Again, pressure was applied to the area and the ganglion cyst was massaged and resorbed back into the tissues. 3.  Unfortunately the patient continues to get a recurrence of the ganglion cyst.  If there is a recurrence after this third time recommend that the patient contact our office and we will go ahead and order MRI since the patient continues to have recurrent cyst in different locations along the dorsum of the foot. 4.  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 Edrick Kins, DPM    [address], [place] [address]                Office [phone]  Fax [phone]

## 2021-09-24 ENCOUNTER — Encounter: Payer: Self-pay | Admitting: Podiatry

## 2021-09-24 DIAGNOSIS — M67472 Ganglion, left ankle and foot: Secondary | ICD-10-CM

## 2021-09-29 ENCOUNTER — Ambulatory Visit
Admission: RE | Admit: 2021-09-29 | Discharge: 2021-09-29 | Disposition: A | Discharge status: Home / Self Care | Payer: Commercial Managed Care - PPO | Source: Ambulatory Visit | Attending: Podiatry | Admitting: Podiatry

## 2021-09-29 DIAGNOSIS — M67472 Ganglion, left ankle and foot: Secondary | ICD-10-CM

## 2021-09-29 MED ORDER — GADOBENATE DIMEGLUMINE 529 MG/ML IV SOLN
20.0000 mL | Freq: Once | INTRAVENOUS | Status: AC | PRN
  Administered 2021-09-29: 20 mL via INTRAVENOUS

## 2021-10-09 NOTE — Telephone Encounter (Signed)
Please follow up with this patient please. Thanks

## 2021-10-10 NOTE — Telephone Encounter (Signed)
Please call patient for surgical consult/appointment. Thanks, Dr. Amalia Hailey

## 2021-11-09 ENCOUNTER — Ambulatory Visit: Payer: Commercial Managed Care - PPO | Admitting: Podiatry

## 2021-11-09 DIAGNOSIS — G5762 Lesion of plantar nerve, left lower limb: Secondary | ICD-10-CM

## 2021-11-09 MED ORDER — BETAMETHASONE SOD PHOS & ACET 6 (3-3) MG/ML IJ SUSP
3.0000 mg | Freq: Once | INTRAMUSCULAR | Status: AC
  Administered 2021-11-09: 3 mg via INTRA_ARTICULAR

## 2021-11-09 MED ORDER — MELOXICAM 15 MG PO TABS: 15.0000 mg | ORAL_TABLET | Freq: Every day | ORAL | 1 refills | Status: DC

## 2021-11-09 MED ORDER — METHYLPREDNISOLONE 4 MG PO TBPK: ORAL_TABLET | ORAL | 0 refills | Status: AC

## 2021-11-09 NOTE — Progress Notes (Signed)
HPI: 46 y.o. male presenting today for follow-up evaluation of left foot pain.  Patient states that most recently he has been experiencing numbness and tingling and shooting sensations to the second third and fourth toes of the left foot.  Especially painful and symptomatic when he walks stairs or applies pressure to the forefoot.  He says that currently he is not in a position to surgically remove the ganglion cysts which are recurrent to the dorsum of the left foot.  He presents for further treatment and evaluation  Past Medical History:  Diagnosis Date   GERD (gastroesophageal reflux disease)    SBO (small bowel obstruction) ([facility])    ecoli infection   Treadmill stress test negative for angina pectoris    WANTED A BASELINE TEST ---    Past Surgical History:  Procedure Laterality Date   ANTERIOR CERVICAL DECOMP/DISCECTOMY FUSION  04/04/2012   Procedure: ANTERIOR CERVICAL DECOMPRESSION/DISCECTOMY FUSION 2 LEVELS;  Surgeon: Otilio Connors, MD;  Location: [facility];  Service: Neurosurgery;  Laterality: N/A;  Cervical four-five, five-six Anterior cervical decompression/diskectomy/fusion/lifenet bone/trestle plate   KNEE SURGERY     thumb surgery      Allergies  Allergen Reactions   Codeine Anaphylaxis     Physical Exam: General: The patient is alert and oriented x3 in no acute distress.  Dermatology: Skin is warm, dry and supple bilateral lower extremities. Negative for open lesions or macerations.  Vascular: Palpable pedal pulses bilaterally. Capillary refill within normal limits.  Negative for any significant edema or erythema  Neurological: Light touch and protective threshold grossly intact  Musculoskeletal Exam: Palpable tender nonadhered fluctuant nodule noted to the dorsum of the left foot overlying the EHL tendon consistent with a ganglion cyst. Today there is also pain on palpation to the second intermetatarsal space of the left foot.  Pain with lateral compression of  the metatarsal heads also noted.  This elicits pain also to the respective toes consistent with Morton's neuroma  Radiographic Exam LT foot 08/10/2021:  Normal osseous mineralization. Joint spaces preserved. No fracture/dislocation/boney destruction.  Otherwise normal exam  MR FOOT LT W/WO CONTRAST 09/29/2021 IMPRESSION: 0.8 x 0.6 x 2.4 cm ganglion cyst along the medial dorsal midfoot at the base of the first and second metatarsals, abutting an extensor tendon.   Small intermetatarsal neuroma in the second webspace measuring 0.9 x 0.3 cm. Adjacent intermetatarsal inflammatory change without significant bursitis. Mild dorsal lateral forefoot soft tissue swelling.   No acute osseous abnormality. 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  Assessment: 1.  Recurrent ganglion cysts left foot.   2.  Morton's neuroma second intermetatarsal space left   Plan of Care:  1. Patient evaluated.  2.  Injection of 0.5 cc Celestone Soluspan injected into the second interspace of the left foot. 3.  Prescription for Medrol Dosepak 4.  Prescription for meloxicam 15 mg daily after completion of the Dosepak 5.  Continue wearing Aetrex insoles in his work boots to offload pressure from the forefoot 6.  I explained to the patient that the ganglion cyst of the left midfoot are unrelated to the findings today consistent with a Morton's neuroma to the second interspace of the left foot.  Patient states that he gets numbness and tingling to the toes and this is likely associated to the Morton's neuroma versus the recurrent ganglion cysts which are more proximal.  Patient understands 7.  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 Edrick Kins, DPM    [address], [place] [address]                Office [phone]  Fax [phone]

## 2022-12-27 ENCOUNTER — Ambulatory Visit: Payer: Commercial Managed Care - PPO | Admitting: Podiatry

## 2022-12-27 ENCOUNTER — Ambulatory Visit (INDEPENDENT_AMBULATORY_CARE_PROVIDER_SITE_OTHER): Payer: Commercial Managed Care - PPO

## 2022-12-27 DIAGNOSIS — M722 Plantar fascial fibromatosis: Secondary | ICD-10-CM

## 2022-12-27 DIAGNOSIS — R52 Pain, unspecified: Secondary | ICD-10-CM

## 2022-12-27 MED ORDER — BETAMETHASONE SOD PHOS & ACET 6 (3-3) MG/ML IJ SUSP
3.0000 mg | Freq: Once | INTRAMUSCULAR | Status: AC
Start: 2022-12-27 — End: 2022-12-27
  Administered 2022-12-27: 3 mg via INTRA_ARTICULAR

## 2022-12-27 MED ORDER — MELOXICAM 15 MG PO TABS: 15.0000 mg | ORAL_TABLET | Freq: Every day | ORAL | 1 refills | Status: AC

## 2022-12-27 MED ORDER — METHYLPREDNISOLONE 4 MG PO TBPK: ORAL_TABLET | ORAL | 0 refills | Status: AC

## 2022-12-27 NOTE — Progress Notes (Signed)
   Chief Complaint  Patient presents with   Foot Pain    C/o right heel pain radiating to the achilles tendon. Patient heard something pop right heel and felt it in his calf. He is not taking any medications at this time.     Subjective: 47 y.o. male presenting today for new onset of pain and tenderness associated to the bilateral heels.  This been ongoing for several months now.  He does have a history of plantar fasciitis.  Right is much more severe and worse than his left.  Currently he has been wearing OTC Dr. Margart Sickles arch supports with minimal relief.   Past Medical History:  Diagnosis Date   GERD (gastroesophageal reflux disease)    SBO (small bowel obstruction) ([facility])    ecoli infection   Treadmill stress test negative for angina pectoris    WANTED A BASELINE TEST ---     Objective: Physical Exam General: The patient is alert and oriented x3 in no acute distress.  Dermatology: Skin is warm, dry and supple bilateral lower extremities. Negative for open lesions or macerations bilateral.   Vascular: Dorsalis Pedis and Posterior Tibial pulses palpable bilateral.  Capillary fill time is immediate to all digits.  Neurological: Grossly intact via light touch  Musculoskeletal: Tenderness to palpation to the plantar aspect of the bilateral heels along the plantar fascia. All other joints range of motion within normal limits bilateral. Strength 5/5 in all groups bilateral.   Radiographic exam RT foot 12/27/2022: Normal osseous mineralization. Joint spaces preserved. No fracture/dislocation/boney destruction. No other soft tissue abnormalities or radiopaque foreign bodies.   Assessment: 1. plantar fasciitis bilateral feet  Plan of Care:  -Patient evaluated. Xrays reviewed.   -Injection of 0.5cc Celestone soluspan injected into the bilateral heels.  -Rx for Medrol Dose Pak placed -Rx for Meloxicam 15 mg ordered for patient. -Due to the severity and pain of the right heel, Cam  boot dispensed for immobilization to allow the foot to rest.  WBAT x 4 weeks -Power step insoles were also dispensed.  Wear daily -Instructed patient regarding therapies and modalities at home to alleviate symptoms.  -Custom molded orthotics were also discussed with the patient today.  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 Felecia Shelling, DPM    [address] [place], [place] [address]                Office [phone]  Fax [phone]

## 2023-01-24 ENCOUNTER — Ambulatory Visit (INDEPENDENT_AMBULATORY_CARE_PROVIDER_SITE_OTHER): Payer: Commercial Managed Care - PPO | Admitting: Podiatry

## 2023-01-24 DIAGNOSIS — Z91199 Patient's noncompliance with other medical treatment and regimen due to unspecified reason: Secondary | ICD-10-CM

## 2023-01-24 NOTE — Progress Notes (Signed)
   Complete physical exam  Patient: Darrell Cooper   DOB: 01/23/2000   47 y.o. Male  MRN: [id number]  Subjective:    No chief complaint on file.   Darrell Cooper is a 47 y.o. male who presents today for a complete physical exam. She reports consuming a {diet types:17450} diet. {types:19826} She generally feels {DESC; WELL/FAIRLY WELL/POORLY:18703}. She reports sleeping {DESC; WELL/FAIRLY WELL/POORLY:18703}. She {does/does not:200015} have additional problems to discuss today.    Most recent fall risk assessment:    09/30/2021   10:42 AM  Fall Risk   Falls in the past year? 0  Number falls in past yr: 0  Injury with Fall? 0  Risk for fall due to : No Fall Risks  Follow up Falls evaluation completed     Most recent depression screenings:    09/30/2021   10:42 AM 08/21/2020   10:46 AM  PHQ 2/9 Scores  PHQ - 2 Score 0 0  PHQ- 9 Score 5     {VISON DENTAL STD PSA (Optional):27386}  {History (Optional):23778}  Patient Care Team: Jessup, Joy, NP as PCP - General (Nurse Practitioner)   Outpatient Medications Prior to Visit  Medication Sig   fluticasone (FLONASE) 50 MCG/ACT nasal spray Place 2 sprays into both nostrils in the morning and at bedtime. After 7 days, reduce to once daily.   norgestimate-ethinyl estradiol (SPRINTEC 28) 0.25-35 MG-MCG tablet Take 1 tablet by mouth daily.   Nystatin POWD Apply liberally to affected area 2 times per day   spironolactone (ALDACTONE) 100 MG tablet Take 1 tablet (100 mg total) by mouth daily.   No facility-administered medications prior to visit.    ROS        Objective:     There were no vitals taken for this visit. {Vitals History (Optional):23777}  Physical Exam   No results found for any visits on 11/05/21. {Show previous labs (optional):23779}    Assessment & Plan:    Routine Health Maintenance and Physical Exam  Immunization History  Administered Date(s) Administered   DTaP 04/08/1999, 06/04/1999,  08/13/1999, 04/28/2000, 11/12/2003   Hepatitis A 09/08/2007, 09/13/2008   Hepatitis B 01/24/1999, 03/03/1999, 08/13/1999   HiB (PRP-OMP) 04/08/1999, 06/04/1999, 08/13/1999, 04/28/2000   IPV 04/08/1999, 06/04/1999, 02/01/2000, 11/12/2003   Influenza,inj,Quad PF,6+ Mos 12/14/2013   Influenza-Unspecified 03/15/2012   MMR 01/31/2001, 11/12/2003   Meningococcal Polysaccharide 09/13/2011   Pneumococcal Conjugate-13 04/28/2000   Pneumococcal-Unspecified 08/13/1999, 10/27/1999   Tdap 09/13/2011   Varicella 02/01/2000, 09/08/2007    Health Maintenance  Topic Date Due   HIV Screening  Never done   Hepatitis C Screening  Never done   INFLUENZA VACCINE  11/03/2021   PAP-Cervical Cytology Screening  11/05/2021 (Originally 01/23/2020)   PAP SMEAR-Modifier  11/05/2021 (Originally 01/23/2020)   TETANUS/TDAP  11/05/2021 (Originally 09/12/2021)   HPV VACCINES  Discontinued   COVID-19 Vaccine  Discontinued    Discussed health benefits of physical activity, and encouraged her to engage in regular exercise appropriate for her age and condition.  Problem List Items Addressed This Visit   None Visit Diagnoses     Annual physical exam    -  Primary   Cervical cancer screening       Need for Tdap vaccination          No follow-ups on file.     Joy Jessup, NP
